# Patient Record
Sex: Female | Born: 1980 | Race: White | Hispanic: No | Marital: Married | Smoking: Never smoker
Health system: Southern US, Community
[De-identification: ages and names within clinical notes are randomized; demographics above are authoritative.]

## PROBLEM LIST (undated history)

## (undated) DIAGNOSIS — N912 Amenorrhea, unspecified: Secondary | ICD-10-CM

## (undated) HISTORY — DX: Amenorrhea, unspecified: N91.2

## (undated) HISTORY — PX: NO PAST SURGERIES: SHX2092

---

## 2005-12-09 ENCOUNTER — Ambulatory Visit (HOSPITAL_COMMUNITY): Admission: RE | Admit: 2005-12-09 | Discharge: 2005-12-09 | Payer: Self-pay | Admitting: Gynecology

## 2006-02-11 ENCOUNTER — Ambulatory Visit: Payer: Self-pay | Admitting: Obstetrics & Gynecology

## 2006-02-19 ENCOUNTER — Ambulatory Visit (HOSPITAL_COMMUNITY): Admission: RE | Admit: 2006-02-19 | Discharge: 2006-02-19 | Payer: Self-pay | Admitting: Obstetrics & Gynecology

## 2006-02-25 ENCOUNTER — Ambulatory Visit: Payer: Self-pay | Admitting: Obstetrics & Gynecology

## 2006-03-04 ENCOUNTER — Ambulatory Visit: Payer: Self-pay | Admitting: Obstetrics & Gynecology

## 2006-03-11 ENCOUNTER — Ambulatory Visit: Payer: Self-pay | Admitting: Family Medicine

## 2006-03-18 ENCOUNTER — Ambulatory Visit: Payer: Self-pay | Admitting: Obstetrics & Gynecology

## 2006-03-25 ENCOUNTER — Ambulatory Visit: Payer: Self-pay | Admitting: Obstetrics & Gynecology

## 2006-03-29 ENCOUNTER — Ambulatory Visit (HOSPITAL_COMMUNITY): Admission: RE | Admit: 2006-03-29 | Discharge: 2006-03-29 | Payer: Self-pay | Admitting: Gynecology

## 2006-04-01 ENCOUNTER — Ambulatory Visit: Payer: Self-pay | Admitting: Obstetrics & Gynecology

## 2006-04-02 ENCOUNTER — Observation Stay (HOSPITAL_COMMUNITY): Admission: AD | Admit: 2006-04-02 | Discharge: 2006-04-02 | Payer: Self-pay | Admitting: Family Medicine

## 2006-04-08 ENCOUNTER — Ambulatory Visit: Payer: Self-pay | Admitting: Obstetrics & Gynecology

## 2006-04-15 ENCOUNTER — Ambulatory Visit: Payer: Self-pay | Admitting: Obstetrics & Gynecology

## 2006-04-22 ENCOUNTER — Ambulatory Visit: Payer: Self-pay | Admitting: Obstetrics & Gynecology

## 2006-04-26 ENCOUNTER — Ambulatory Visit: Payer: Self-pay | Admitting: Gynecology

## 2006-04-26 ENCOUNTER — Inpatient Hospital Stay (HOSPITAL_COMMUNITY): Admission: AD | Admit: 2006-04-26 | Discharge: 2006-04-27 | Payer: Self-pay | Admitting: Gynecology

## 2006-05-24 IMAGING — US US OB FOLLOW-UP
1 series · 13 of 28 positions shown · non-contrast
Comparison: none

CLINICAL DATA: Assess growth.  Size greater than dates.

[Series 1: us ob follow-up · 13 of 54 slices shown]
[im 2/54]
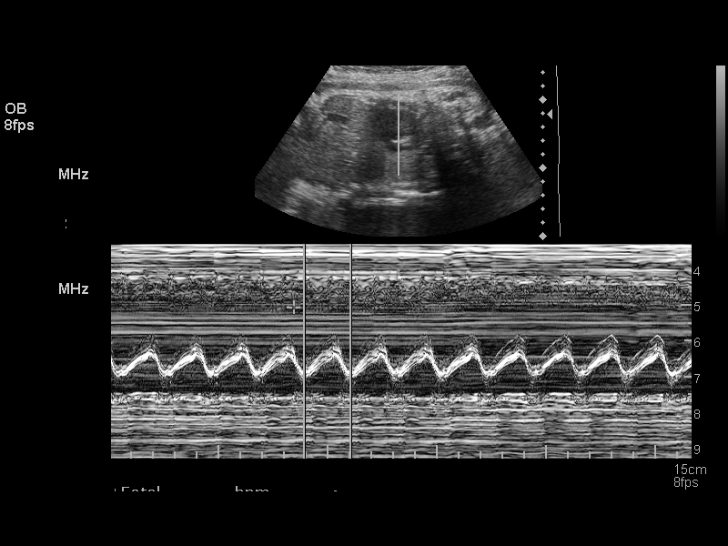
[im 6/54]
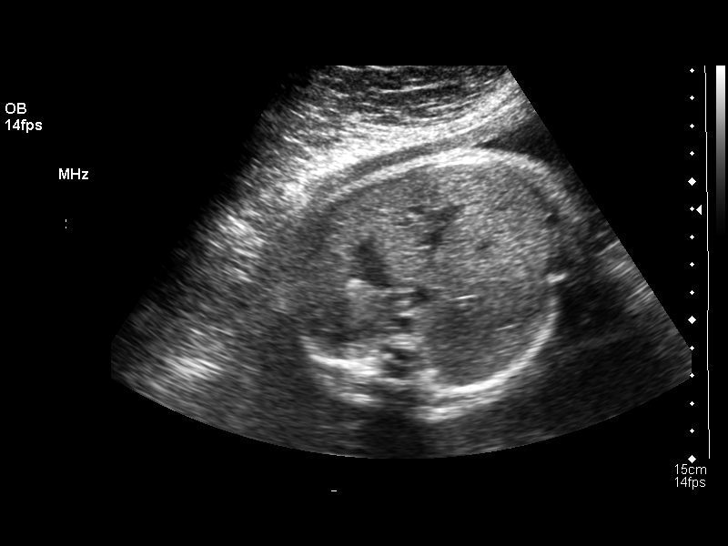
[im 10/54]
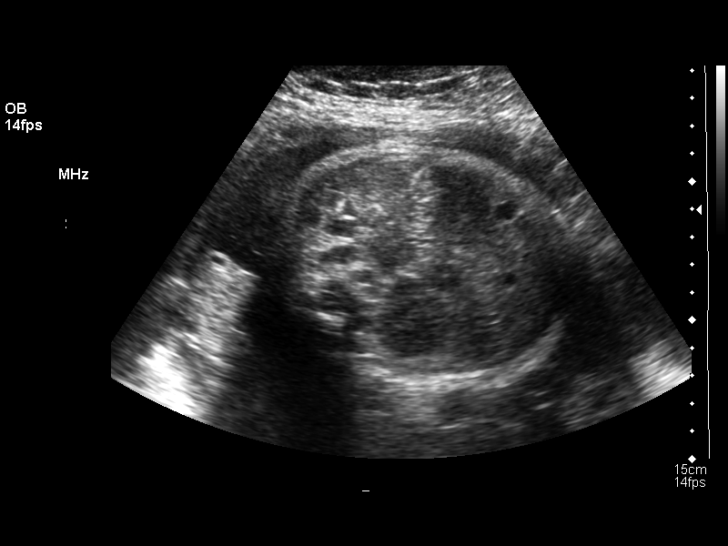
[im 14/54]
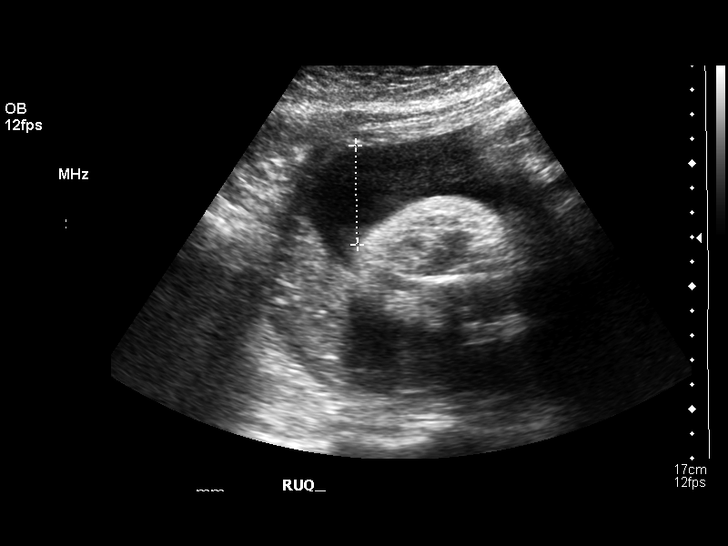
[im 18/54]
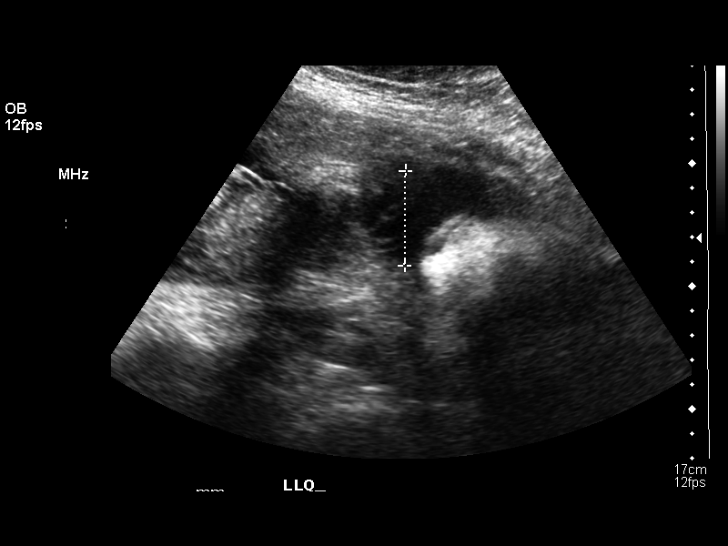
[im 22/54]
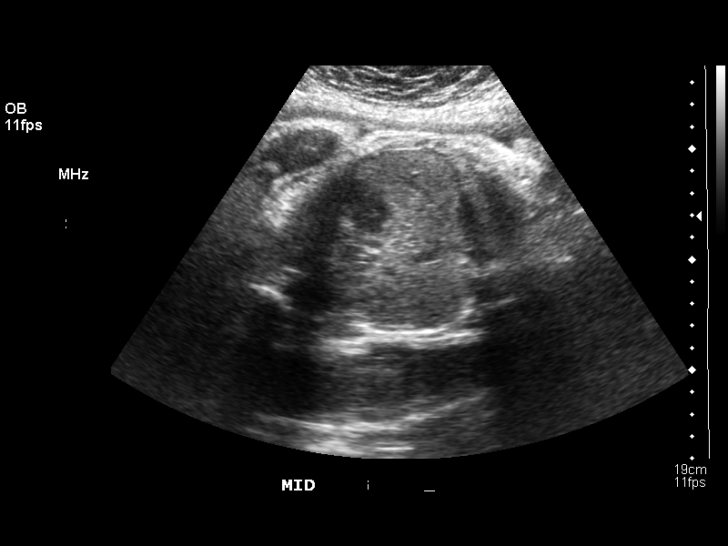
[im 28/54]
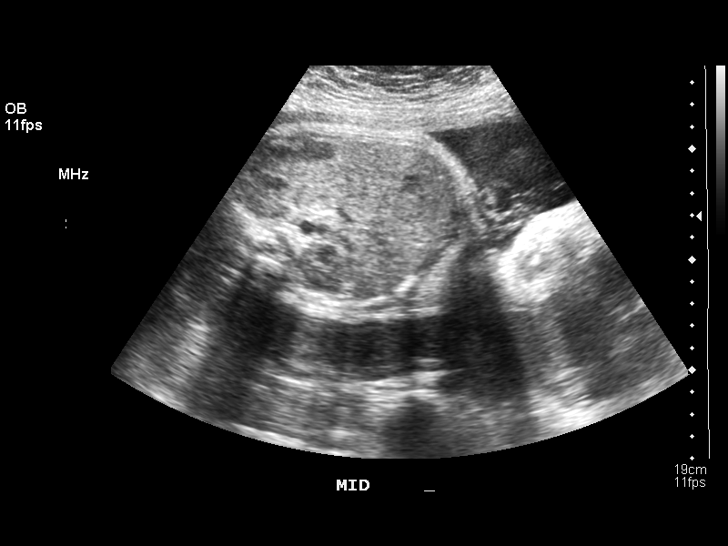
[im 32/54]
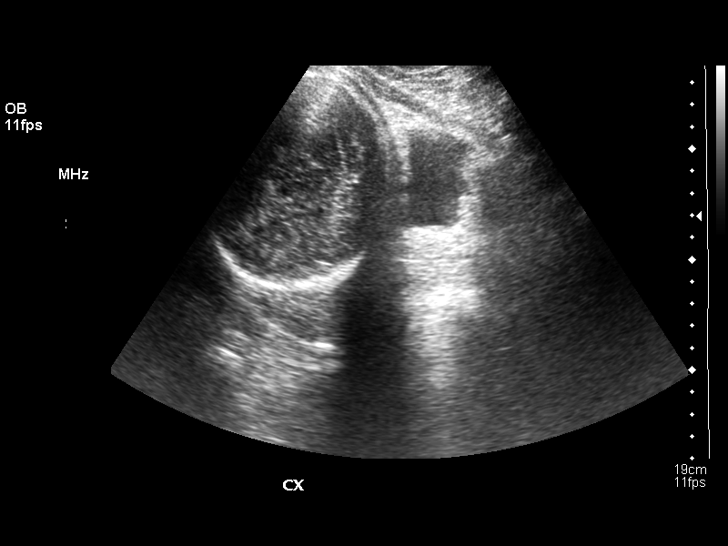
[im 36/54]
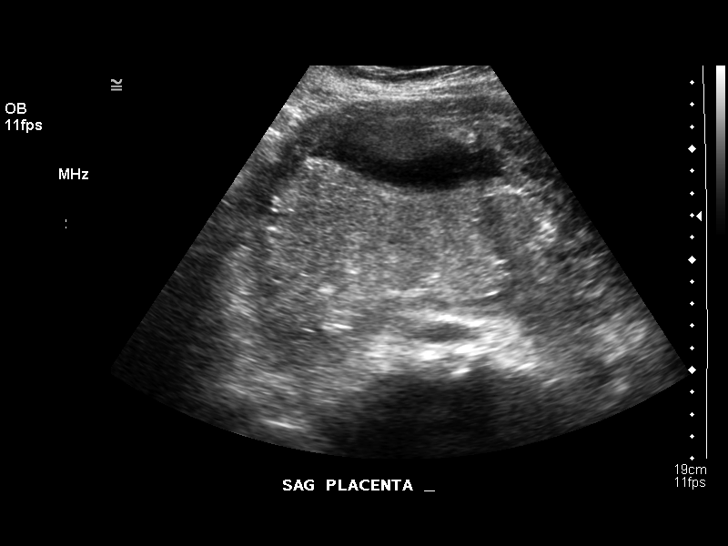
[im 40/54]
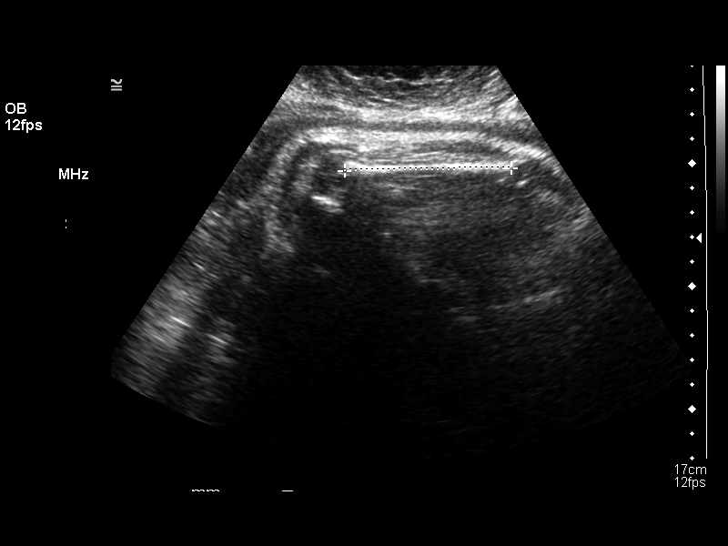
[im 44/54]
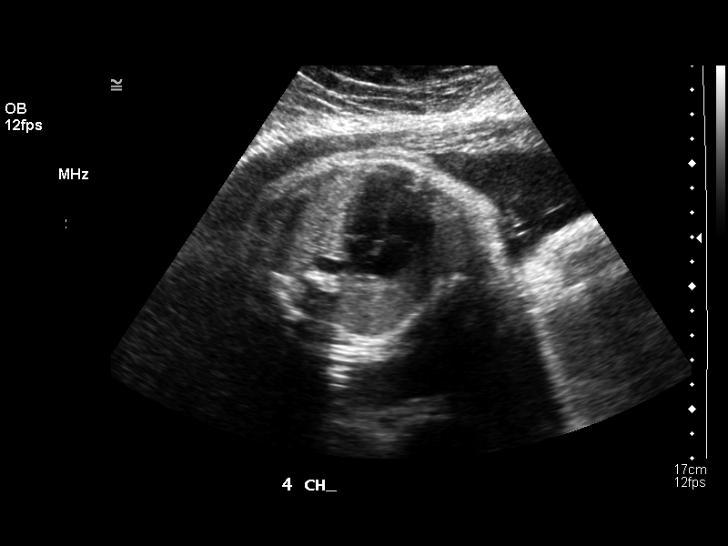
[im 48/54]
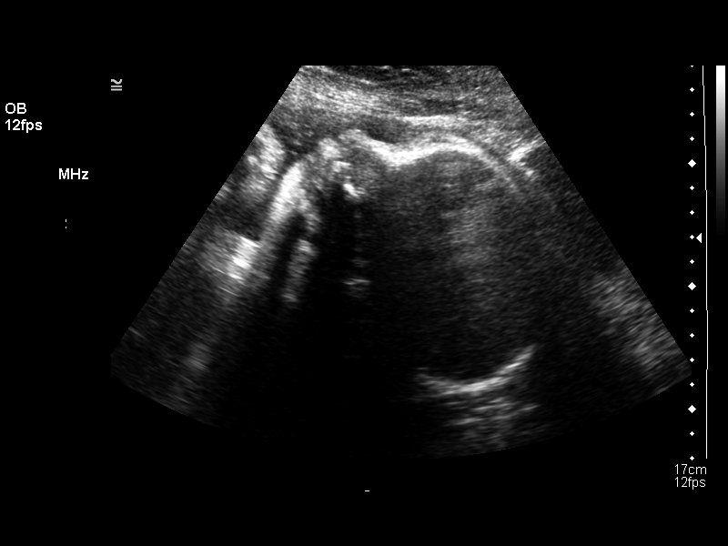
[im 52/54]
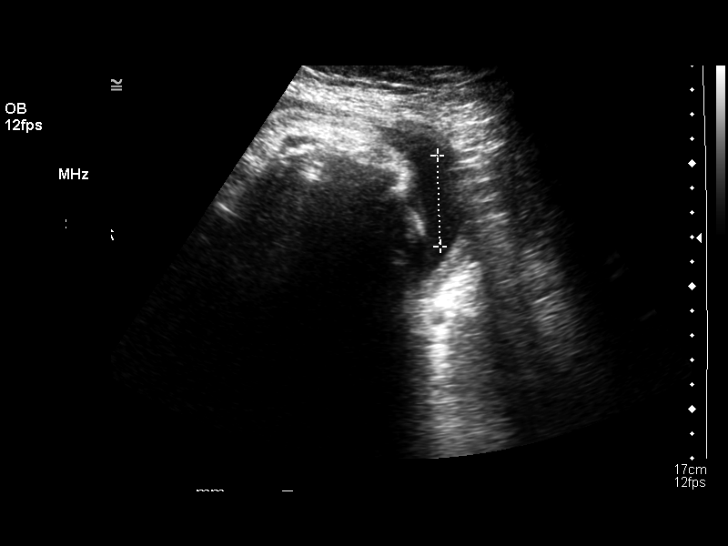

[13 of 28 positions shown; findings below may reference images not displayed]

OBSTETRICAL ULTRASOUND RE-EVALUATION:
Number of Fetuses:  1
Heart Rate: 141 BPM
Movement:  Yes
Breathing:  Yes
Presentation:  Cephalic
Placental Location:  Posterior
Grade:  I
Previa:  No
Amniotic Fluid (subjective):  Normal
Amniotic Fluid (objective):  AFI 13.0 cm (5th-25th %ile = 7.9 to 24.9 cm for 35 weeks) 

FETAL BIOMETRY
BPD:  3.5 cm  34 W 3 D
HC:  32.5 cm  36 W 5 D
AC:  21.7 cm  35 W 4 D
FL:  6.8 cm  34 W 6 D

Mean GA:  35 W 3 D  US EDC:  04/30/06
Assigned GA:  34 W 6 D  Assigned EDC:  05/04/06
Fetal indices are within normal limits. 
EFW:  2770   grams - 35th-13th %ile (3767 ? 0816 g) for 35 weeks 

FETAL ANATOMY
Lateral Ventricles:    Visualized 
Thalami/CSP:    Previously seen 
Posterior Fossa:    Previously seen 
Nuchal Region:    Previously seen 
Spine:    Previously seen 
4 Chamber Heart on Left:  Visualized 
Stomach on Left:  Visualized 
3 Vessel Cord:  Visualized 
Cord Insertion Site:  Previously seen 
Kidneys:  Previously seen 
Bladder:  Visualized 
Extremities:  Previously seen 

ADDITIONAL ANATOMY VISUALIZED:  Profile and diaphragm.

MATERNAL UTERINE AND ADNEXAL FINDINGS
Cervix:  Not evaluated.
IMPRESSION: 1.  Single intrauterine pregnancy demonstrating an estimated gestational age by ultrasound of 35 weeks and 3 days.  Correlation with assigned gestational age of 34 weeks and 6 days correlates with appropriate growth.  Currently, the estimated fetal weight is just below the 75th percentile for a 35 week gestation and the fetal weight was just above the 75th percentile on study performed at 29 weeks which suggests appropriate linear growth.
2.  Subjectively and quantitatively normal amniotic fluid volume.
3.  No late-developing fetal anatomic abnormalities are seen with associated lateral ventricles, four-chamber heart, stomach, kidneys or bladder.

## 2006-07-01 ENCOUNTER — Ambulatory Visit: Payer: Self-pay | Admitting: Obstetrics & Gynecology

## 2006-07-01 ENCOUNTER — Encounter: Payer: Self-pay | Admitting: Obstetrics & Gynecology

## 2007-05-05 ENCOUNTER — Encounter: Payer: Self-pay | Admitting: Obstetrics & Gynecology

## 2007-05-05 ENCOUNTER — Ambulatory Visit: Payer: Self-pay | Admitting: Obstetrics & Gynecology

## 2008-05-03 ENCOUNTER — Ambulatory Visit: Payer: Self-pay | Admitting: Obstetrics & Gynecology

## 2008-05-03 ENCOUNTER — Encounter: Payer: Self-pay | Admitting: Obstetrics & Gynecology

## 2009-05-27 ENCOUNTER — Ambulatory Visit: Payer: Self-pay | Admitting: Obstetrics & Gynecology

## 2009-05-27 ENCOUNTER — Encounter: Payer: Self-pay | Admitting: Obstetrics & Gynecology

## 2010-07-24 ENCOUNTER — Ambulatory Visit: Payer: Self-pay | Admitting: Obstetrics & Gynecology

## 2011-04-21 NOTE — Assessment & Plan Note (Signed)
[name], [name] NO.:  [id number]   MEDICAL RECORD NO.:  [id number]          PATIENT TYPE:  POB   LOCATION:  [facility] at [facility]         FACILITY:  [facility]   PHYSICIAN:  Elsie Lincoln, MD      DATE OF BIRTH:  03-04-1981   DATE OF SERVICE:  05/27/2009                                  CLINIC NOTE   The patient is a 30 year old para 3 female who presents for yearly exam.  The patient uses the [place] for birth control.  She has normal periods  usually.  However, 3 months ago, she had a heavy period and in the past  2 months, she has been amenorrheic.  She did have a negative EPT.  The  patient is on the Yaz, which is a 24-day pill and amenorrhea can be  common.  The patient was reassured.  The patient talked about going on  generic pills, but I explained that there is only 80% of product that  needs to be on generic pill and I would not suggest this given that it  is such a low-dose anyways.  Dianah Field has also been shown not to work with a  lower progesterone in clinical trials.  The patient has no other  complaints.  She is done with childbearing.   PAST MEDICAL HISTORY:  Denies all problems.   PAST SURGICAL HISTORY:  Denies all surgeries.   GYNECOLOGIC HISTORY:  Denies all problems.  She has never had an  abnormal Pap smear, ovarian cysts and fibroid tumors, or sexually-  transmitted diseases.   FAMILY HISTORY:  No breasts, colon or endometrial cancer.  Her sister  did get diagnosed with ovarian cancer.  The patient does not know if she  was ever tested for BRCA 1 and 2.  I encouraged the patient to talk to  her sister and if she has not been tested or refuses to be tested, then  we can test [facility].   MEDICATIONS:  Yaz.   ALLERGIES:  None to medications or latex.   REVIEW OF SYSTEMS:  Negative for all problems.   SOCIAL HISTORY:  The patient lives with her husband, 3 daughters, and  her mother.  She does drink 3 caffeinated beverages a day.  She does not  smoke, drink alcohol, or do addictive drugs.  She has never been a  victim of sexual abuse.   PHYSICAL EXAMINATION:  VITAL SIGNS:  Pulse 80, blood pressure 125/85,  weight 183, height 5 feet 6 inches.  GENERAL:  A well nourished, well developed in no apparent distress.  HEENT:  Normocephalic, atraumatic.  Good dentition.  Thyroid, no masses.  LUNGS:  Clear to auscultation bilaterally.  HEART:  Regular rate and rhythm.  BREASTS:  No masses, no lymphadenopathy.  ABDOMEN:  Soft, nontender.  No organomegaly.  No hernia.  PELVIC:  Genitalia, Tanner V.  Vagina pink, normal rugae.  Urethra and  bladder nontender.  Cervix closed.  Uterus nontender.  Adnexa, no  masses, nontender.  EXTREMITIES:  Nontender.   ASSESSMENT/PLAN:  A 30 year old well-woman female.  1. Pap smear.  2. Yaz prescription given.  3. We will test for breast cancer antigen 1 and  2 if the patient      desires.  4. Offered patient baseline cholesterol labs and a basic metabolic      panel given that she had gestational diabetes mellitus in the past      and never got her followup 2-hour GTT.  The patient will schedule      if she so desires.  Otherwise, return in a year.           ______________________________  Elsie Lincoln, MD     KL/MEDQ  D:  05/27/2009  T:  05/28/2009  Job:  [id number]

## 2011-04-21 NOTE — Assessment & Plan Note (Signed)
NAMEGRACEANNA, [name] NO.:  [id number]   MEDICAL RECORD NO.:  [id number]          PATIENT TYPE:  POB   LOCATION:  [facility] at [place]         FACILITY:  [facility]   PHYSICIAN:  Elsie Lincoln, MD      DATE OF BIRTH:  23-Dec-1980   DATE OF SERVICE:  07/24/2010                                  CLINIC NOTE   The patient is a 30 year old para 3 female who presents for her yearly  exam.  The patient has had one episode of long period with Yasmin.  Later this could be normal with the 20 mcg pill and she is relieved,  otherwise she has been very happy with Yasmin.  The patient's sister has  not inquire for the patient never been tested for BRCA gene given that  she had ovarian cancer.  The patient said that she will check.  The  patient needs a fasting glucose and cholesterol now that she is 28.   PAST MEDICAL HISTORY:  Denies all problems.   PAST SURGICAL HISTORY:  Denies all surgeries.   GYNECOLOGIC HISTORY:  The patient has never had abnormal Pap smear,  ovarian cyst, fibroid tumors, endometriosis, or sexually transmitted  disease.  We discussed that the patient can now get at least every 2  years for Pap smears and the patient feels good about this.   FAMILY HISTORY:  Ovarian cancer in her sister in her 54s.  The patient  will inquire if she has ever been tested for BRCA of the breast, colon,  or endometrial cancer.   MEDICATIONS:  Yasmin.   REVIEW OF SYSTEM:  Negative except for the bleeding.   SOCIAL HISTORY:  No smoking, drinking, or alcohol, never been a victim  of sexual, physical abuse.   PHYSICAL EXAMINATION:  VITAL SIGNS:  Pulse 56, blood pressure 135/77,  weight 156, height 56 inches.  GENERAL:  Well-nourished, well-developed, in no apparent distress.  HEENT:  Normocephalic, atraumatic.  Thyroid, no masses.  LUNGS:  Clear to auscultation bilaterally.  HEART:  Regular rate and rhythm.  BREASTS:  No masses and nontender.  No nipple discharge.  No  lymphadenopathy.  ABDOMEN:  Soft, nontender, no organomegaly, and no hernia.  The patient  has a bruise on the left side of her abdomen that she said she obtained  moving furniture.  GENITALIA:  Tanner V.  Vagina pink, normal rugae.  Cervix closed,  nontender.  Uterus anteverted, nontender.  Adnexa; no masses, nontender.  Rectum, no hemorrhoids.  Bladder nontender.  Urethra nontender.  EXTREMITIES:  No edema.   ASSESSMENT AND PLAN:  A 30 year old well-woman female:  1. Pap smear.  2. Fasting basic metabolic panel and CBC and cholesterol panel.  3. Return to clinic in a year, but no Pap smear next time.  4. Refill on Yasmin.           ______________________________  Elsie Lincoln, MD     KL/MEDQ  D:  07/24/2010  T:  07/25/2010  Job:  [id number]

## 2011-04-21 NOTE — Assessment & Plan Note (Signed)
NAMETROYCE, FEBO NO.:  [id number]   MEDICAL RECORD NO.:  [id number]          PATIENT TYPE:  POB   LOCATION:  [facility] at [facility]         FACILITY:  [facility]   PHYSICIAN:  Elsie Lincoln, MD      DATE OF BIRTH:  November 01, 1981   DATE OF SERVICE:  05/05/2007                                  CLINIC NOTE   The patient is a 31 year old female para 3 who presents for a yearly  exam.  The patient delivered almost a year ago her third baby.  She uses  progesterone only pill for contraception and she is weaning the baby as  we speak and she would like to switch to a combination oral  contraceptive until her husband gets a vasectomy.  She was on Ortho-Tri-  Cyclen in the past but felt that she was moody.  We talked about a 24/4  pill and we agreed on Yasmin.  She also could be a candidate for [facility]  or Depo-Provera __________  if these do not work her for her and she  still finds she is moody.  She is also a candidate for continuous oral  contraceptives or [place]ique.  The patient is not having depression.  No  change in her past medical history.  She did have gestational diabetes  and has not had a 2 hour GTT which she needs to schedule at some point.   PHYSICAL EXAMINATION:  HEENT:  Normocephalic, atraumatic.  Lungs clear  to auscultation __________ .  Heart:  Regular rate and rhythm.  Neck:  No masses.  Breasts:  Nontender, no masses, no lymphadenopathy or nipple  discharge.  Self breast exams encouraged.  Abdomen:  Soft, nontender, no  organomegaly, no hernia.  Genitalia:  __________  normal rugae.  Cervix  closed, nontender.  Uterus mobile, nontender.  Adnexae no masses,  nontender.  Rectum:  No hemorrhoids.  Extremities:  Nontender, no edema.   ASSESSMENT AND PLAN:  A 30 year old female doing well and needs a change  in her contraceptive.   1. Pap smear.  2. Two samples of Yasmin given.  If she likes this then we will call      this in to her pharmacy.  If she is  not doing well then we will      consider other methods and she needs to come back in and talk to [place]      at that time.  3. Needs 2 hour GTT at some point.  4. Physical in a year.           ______________________________  Elsie Lincoln, MD     KL/MEDQ  D:  05/05/2007  T:  05/05/2007  Job:  [id number]

## 2011-04-21 NOTE — Assessment & Plan Note (Signed)
NAMEALTA, [name] NO.:  [id number]   MEDICAL RECORD NO.:  [id number]          PATIENT TYPE:  POB   LOCATION:  [facility] at [facility]         [facility]ACILITY:  [facility]   PHYSICIAN:  Elsie Lincoln, MD      DATE OF BIRTH:  07/10/1981   DATE OF SERVICE:  05/03/2008                                  CLINIC NOTE    The patient is a 30 year old para 3 female who presents for her yearly  exam.  Her last menstrual period was Apr 09, 2008.  She is on Yaz birth  control and is happy with it, other than bleeding right before the  Placebo pills, this is most likely due to the Yaz's low estrogen dose,  but she is not skipping any pills and is protected.  I offered a change  to her and she is fine where she is at.  She has absolutely no  complaints.   PAST MEDICAL HISTORY:  No changes in the past year.  She never did get  her 2-hour GTT from being positive for GDM, so she said she would do so.  No surgeries in the past year.  GYN complaints, none.   ALLERGIES:  None.   MEDICATIONS:  Yaz.   FAMILY HISTORY:  No breast, ovarian, colon, or endometrial cancer.  She  said there was someone who did need a hysterectomy in her family.  She  was not sure if it was for cancer or not, but they did not receive  chemotherapy or radiation.   PHYSICAL EXAMINATION:  VITAL SIGNS:  Blood pressure 136/90, weight 173,  height 5 feet 5 inches, and pulse 71.  GENERAL:  Well-nourished and well-developed in no apparent distress.  HEENT:  Normocephalic and atraumatic.  Good dentition.  The patient has  a fair amount of froth.  LUNGS:  Clear to auscultation bilaterally.  HEART:  Regular rate and rhythm.  BREASTS:  No masses.  Nontender.  No lymphadenopathy.  ABDOMEN:  No hepatosplenomegaly.  No hernia.  Soft and nontender.  GENITALIA:  Tanner V.  Urethra, nontender.  No cystocele or rectocele.  Vagina, pink.  Normal rugae.  Cervix, closed and nontender.  Uterus,  anteverted and nontender.  Adnexa, no  masses and nontender.  No  hemorrhoids.  EXTREMITIES:  No edema.   ASSESSMENT/PLAN:  A 30 year old female with well-woman exam.  1. Pap smear.  2. Needs 2-hour glucose tolerance test.  3. Borderline hypertension.  We will recheck when she comes in for her      2-hour glucose tolerance test.  We will also do a fasting      cholesterol at that time.           ______________________________  Elsie Lincoln, MD     KL/MEDQ  D:  05/03/2008  T:  05/03/2008  Job:  [id number]

## 2011-04-24 NOTE — Group Therapy Note (Signed)
[name], [name] NO.:  [id number]   MEDICAL RECORD NO.:  [id number]          PATIENT TYPE:  POB   LOCATION[facility]  [facility]                   FACILITY:  [facility]   PHYSICIAN:  Nancy Lincoln, MD      DATE OF BIRTH:  Nov 07, 1981   DATE OF SERVICE:  07/01/2006                                    CLINIC NOTE   The patient is a 30 year old female who presents for a postpartum exam. The  patient has delivered NSVD approximately 8 weeks ago without complications,  this was her third delivery. She had no lacerations. She does have mild GDM  that was controlled by diet. She is breast feeding. She resumed sexual  intercourse several weeks ago without complications. She is on Micronor. She  had a little spotting around July 4 but is otherwise doing fine. She plans  on breast feeding until the baby teeth's and then she will switch to  combined oral contraceptive. She wishes her partner to get a vasectomy, not  planning anymore children.   PHYSICAL EXAMINATION:  BREASTS:  No evidence of mastitis.  ABDOMEN:  Soft, tender, nondistended.  GENITALIA:  __________, perineum intact. Cervix is closed, nontender. Uterus  firm, anteverted, nontender.   ASSESSMENT/PLAN:  A 30 year old female well postpartum check, no signs of  depression. Continue progesterone only pill. Return to clinic in a year or  p.r.n.           ______________________________  Nancy Lincoln, MD     KL/MEDQ  D:  07/01/2006  T:  07/01/2006  Job:  [id number]

## 2011-04-24 NOTE — Discharge Summary (Signed)
[name], [name]              ACCOUNT NO.:  [id number]   MEDICAL RECORD NO.:  [id number]          PATIENT TYPE:  WOC   LOCATION:  [facility]                          FACILITY:  [facility]   PHYSICIAN:  Angie B. Merlene Morse, MD  DATE OF BIRTH:  1981-01-03   DATE OF ADMISSION:  04/01/2006  DATE OF DISCHARGE:  04/02/2006                                 DISCHARGE SUMMARY   ADMISSION DIAGNOSES:  1.  Intrauterine pregnancy at 35-3/7 weeks.  2.  Gestational diabetes.  3.  Latent labor.   HISTORY OF PRESENT ILLNESS:  The patient is a 30 year old, G3, P2-0-0-2 who  presented complaining of contractions.  Her exam on initial presentation was  3 cm, 50% and -2.   HOSPITAL COURSE:  The patient was monitored in the MAU.  She was found to  have cervical change.  In 1 hour and 10 minutes, she changed to 3-4 cm, 50%  and -2.  She was admitted and started on Unasyn as she had unknown GBS and  was given a dose of Procardia.  She remained stable and her contractions  resolved.  Her vaginal exam at the time of discharge was 4 cm, 50% and -2.  The patient was discharged to home.   FOLLOW UP:  The patient was to follow up as scheduled.   SPECIAL INSTRUCTIONS:  She was to return to the hospital for any signs of  labor.           ______________________________  [name] Merlene Morse, MD     ABC/MEDQ  D:  04/02/2006  T:  04/03/2006  Job:  [id number]

## 2011-07-30 ENCOUNTER — Telehealth: Payer: Self-pay | Admitting: *Deleted

## 2011-07-30 ENCOUNTER — Other Ambulatory Visit: Payer: Self-pay | Admitting: Obstetrics & Gynecology

## 2011-07-30 DIAGNOSIS — IMO0001 Reserved for inherently not codable concepts without codable children: Secondary | ICD-10-CM

## 2011-07-30 MED ORDER — DROSPIRENONE-ETHINYL ESTRADIOL 3-0.02 MG PO TABS: 1.0000 | ORAL_TABLET | Freq: Every day | ORAL | Status: DC

## 2011-07-30 NOTE — Telephone Encounter (Signed)
Patient needs refill of Yaz.  Appointment made for September and refills were called into pharmacy.

## 2011-08-27 ENCOUNTER — Encounter: Payer: Self-pay | Admitting: Obstetrics & Gynecology

## 2011-08-27 ENCOUNTER — Ambulatory Visit (INDEPENDENT_AMBULATORY_CARE_PROVIDER_SITE_OTHER): Payer: No Typology Code available for payment source | Admitting: Obstetrics & Gynecology

## 2011-08-27 DIAGNOSIS — Z01419 Encounter for gynecological examination (general) (routine) without abnormal findings: Secondary | ICD-10-CM

## 2011-08-27 DIAGNOSIS — Z1272 Encounter for screening for malignant neoplasm of vagina: Secondary | ICD-10-CM

## 2011-08-27 DIAGNOSIS — Z0000 Encounter for general adult medical examination without abnormal findings: Secondary | ICD-10-CM

## 2011-08-27 NOTE — Progress Notes (Signed)
  Subjective:    Patient ID: [name], female    DOB: Feb 12, 1981, 30 y.o.   MRN: [id number]  HPI    Review of Systems     Objective:   Physical Exam        Assessment & Plan:   Subjective:    [name] is a 30 y.o. female who presents for an annual exam. The patient has no complaints today. The patient is sexually active. GYN screening history: last pap: was normal. The patient wears seatbelts: yes. The patient participates in regular exercise: yes. Has the patient ever been transfused or tattooed?: no. The patient reports that there is not domestic violence in her life.   Menstrual History: OB History    Grav Para Term Preterm Abortions TAB SAB Ect Mult Living   3 3        3       Menarche age: 62 No LMP recorded.    The following portions of the patient's history were reviewed and updated as appropriate: allergies, current medications, past family history, past medical history, past social history, past surgical history and problem list.  Review of Systems A comprehensive review of systems was negative.  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  General Appearance:    Alert, cooperative, no distress, appears stated age  Head:    Normocephalic, without obvious abnormality, atraumatic  Eyes:    PERRL, conjunctiva/corneas clear, EOM's intact, fundi    benign, both eyes  Ears:    Normal TM's and external ear canals, both ears  Nose:   Nares normal, septum midline, mucosa normal, no drainage    or sinus tenderness  Throat:   Lips, mucosa, and tongue normal; teeth and gums normal  Neck:   Supple, symmetrical, trachea midline, no adenopathy;    thyroid:  no enlargement/tenderness/nodules; no carotid   bruit or JVD  Back:     Symmetric, no curvature, ROM normal, no CVA tenderness  Lungs:     Clear to auscultation bilaterally, respirations unlabored    Chest Wall:    No tenderness or deformity   Heart:    Regular rate and rhythm, S1 and S2 normal, no murmur, rub   or gallop  Breast Exam:    No tenderness, masses, or nipple abnormality  Abdomen:     Soft, non-tender, bowel sounds active all four quadrants,    no masses, no organomegaly  Genitalia:    Normal female without lesion, discharge or tenderness,NSSA, NT, no adnexal masses  Rectal:      Extremities:   Extremities normal, atraumatic, no cyanosis or edema  Pulses:   2+ and symmetric all extremities  Skin:   Skin color, texture, turgor normal, no rashes or lesions  Lymph nodes:   Cervical, supraclavicular, and axillary nodes normal  Neurologic:   CNII-XII intact, normal strength, sensation and reflexes    throughout  .    Assessment:    Healthy female exam.    Plan:     Await pap smear results.  Rec SBE/SVE monthly

## 2012-05-11 ENCOUNTER — Encounter: Payer: Self-pay | Admitting: Obstetrics & Gynecology

## 2012-05-11 ENCOUNTER — Ambulatory Visit (INDEPENDENT_AMBULATORY_CARE_PROVIDER_SITE_OTHER): Payer: BC Managed Care – PPO | Admitting: Obstetrics & Gynecology

## 2012-05-11 VITALS — BP 136/79 | HR 66 | Ht 66.0 in | Wt 163.0 lb

## 2012-05-11 DIAGNOSIS — Z309 Encounter for contraceptive management, unspecified: Secondary | ICD-10-CM

## 2012-05-11 DIAGNOSIS — IMO0001 Reserved for inherently not codable concepts without codable children: Secondary | ICD-10-CM

## 2012-05-11 MED ORDER — DROSPIRENONE-ETHINYL ESTRADIOL 3-0.02 MG PO TABS: 1.0000 | ORAL_TABLET | Freq: Every day | ORAL | Status: DC

## 2012-05-11 NOTE — Patient Instructions (Signed)
Return to clinic for any scheduled appointments or for any gynecologic concerns as needed.   

## 2012-05-11 NOTE — Progress Notes (Signed)
Patient came in today for refill of birth control.  No other concerns.  Normal pap smear and exam in 08/2011. OCPs refilled; she will return for annual exam after 08/2012. Return to clinic for any GYN concerns.

## 2013-03-30 ENCOUNTER — Ambulatory Visit: Payer: BC Managed Care – PPO | Admitting: Obstetrics & Gynecology

## 2013-04-14 ENCOUNTER — Encounter: Payer: Self-pay | Admitting: Obstetrics & Gynecology

## 2013-04-14 ENCOUNTER — Ambulatory Visit (INDEPENDENT_AMBULATORY_CARE_PROVIDER_SITE_OTHER): Payer: BC Managed Care – PPO | Admitting: Obstetrics & Gynecology

## 2013-04-14 VITALS — BP 130/85 | HR 66 | Resp 16 | Ht 66.0 in | Wt 174.0 lb

## 2013-04-14 DIAGNOSIS — Z1151 Encounter for screening for human papillomavirus (HPV): Secondary | ICD-10-CM

## 2013-04-14 DIAGNOSIS — Z01419 Encounter for gynecological examination (general) (routine) without abnormal findings: Secondary | ICD-10-CM

## 2013-04-14 DIAGNOSIS — Z0000 Encounter for general adult medical examination without abnormal findings: Secondary | ICD-10-CM

## 2013-04-14 DIAGNOSIS — Z124 Encounter for screening for malignant neoplasm of cervix: Secondary | ICD-10-CM

## 2013-04-14 MED ORDER — GIANVI 3-0.02 MG PO TABS: 1.0000 | ORAL_TABLET | Freq: Every day | ORAL | Status: DC

## 2013-04-14 NOTE — Progress Notes (Signed)
Subjective:    [name] is a 32 y.o. female who presents for an annual exam. The patient has no complaints today. She would like a refill of her OCPs (takes them continuously). The patient is sexually active. GYN screening history: last pap: was normal. The patient wears seatbelts: yes. The patient participates in regular exercise: yes. Has the patient ever been transfused or tattooed?: no. The patient reports that there is not domestic violence in her life.   Menstrual History: OB History   Grav Para Term Preterm Abortions TAB SAB Ect Mult Living   3 3        3       Menarche age: 65  Patient's last menstrual period was 03/31/2013.    The following portions of the patient's history were reviewed and updated as appropriate: allergies, current medications, past family history, past medical history, past social history, past surgical history and problem list.  Review of Systems A comprehensive review of systems was negative. 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 She denies dyspareunia.    Objective:    BP 130/85  Pulse 66  Resp 16  Ht 5\' 6"  (1.676 m)  Wt 174 lb (78.926 kg)  BMI 28.1 kg/m2  LMP 03/31/2013  General Appearance:    Alert, cooperative, no distress, appears stated age  Head:    Normocephalic, without obvious abnormality, atraumatic  Eyes:    PERRL, conjunctiva/corneas clear, EOM's intact, fundi    benign, both eyes  Ears:    Normal TM's and external ear canals, both ears  Nose:   Nares normal, septum midline, mucosa normal, no drainage    or sinus tenderness  Throat:   Lips, mucosa, and tongue normal; teeth and gums normal  Neck:   Supple, symmetrical, trachea midline, no adenopathy;    thyroid:  no enlargement/tenderness/nodules; no carotid   bruit or JVD  Back:     Symmetric, no curvature, ROM normal, no CVA tenderness  Lungs:     Clear to auscultation bilaterally, respirations unlabored  Chest Wall:    No tenderness or deformity   Heart:    Regular rate  and rhythm, S1 and S2 normal, no murmur, rub   or gallop  Breast Exam:    No tenderness, masses, or nipple abnormality  Abdomen:     Soft, non-tender, bowel sounds active all four quadrants,    no masses, no organomegaly  Genitalia:    Normal female without lesion, discharge or tenderness, NSSA, NT, mobile, normal adnexal exam     Extremities:   Extremities normal, atraumatic, no cyanosis or edema  Pulses:   2+ and symmetric all extremities  Skin:   Skin color, texture, turgor normal, no rashes or lesions  Lymph nodes:   Cervical, supraclavicular, and axillary nodes normal  Neurologic:   CNII-XII intact, normal strength, sensation and reflexes    throughout  .    Assessment:    Healthy female exam.    Plan:     Thin prep Pap smear. fasting screening labs in the future

## 2014-06-25 ENCOUNTER — Telehealth: Payer: Self-pay | Admitting: *Deleted

## 2014-06-25 MED ORDER — GIANVI 3-0.02 MG PO TABS: 1.0000 | ORAL_TABLET | Freq: Every day | ORAL | Status: DC

## 2014-06-25 NOTE — Telephone Encounter (Signed)
Patient needs a refill of her ocp.

## 2014-07-31 ENCOUNTER — Ambulatory Visit (INDEPENDENT_AMBULATORY_CARE_PROVIDER_SITE_OTHER): Payer: BC Managed Care – PPO | Admitting: Obstetrics & Gynecology

## 2014-07-31 ENCOUNTER — Encounter: Payer: Self-pay | Admitting: Obstetrics & Gynecology

## 2014-07-31 VITALS — BP 123/93 | HR 70 | Ht 66.0 in | Wt 181.8 lb

## 2014-07-31 DIAGNOSIS — Z1151 Encounter for screening for human papillomavirus (HPV): Secondary | ICD-10-CM | POA: Diagnosis not present

## 2014-07-31 DIAGNOSIS — Z124 Encounter for screening for malignant neoplasm of cervix: Secondary | ICD-10-CM | POA: Diagnosis not present

## 2014-07-31 DIAGNOSIS — Z0000 Encounter for general adult medical examination without abnormal findings: Secondary | ICD-10-CM

## 2014-07-31 DIAGNOSIS — Z3041 Encounter for surveillance of contraceptive pills: Secondary | ICD-10-CM

## 2014-07-31 DIAGNOSIS — Z01419 Encounter for gynecological examination (general) (routine) without abnormal findings: Secondary | ICD-10-CM | POA: Diagnosis not present

## 2014-07-31 NOTE — Progress Notes (Signed)
Subjective:    [name] is a 33 y.o. female who presents for an annual exam. The patient has no complaints today. She is happy with using her OCPs in a continuous fashion as she likes amenorrhea. The patient is sexually active. GYN screening history: last pap: was normal. The patient wears seatbelts: yes. The patient participates in regular exercise: yes. Has the patient ever been transfused or tattooed?: no. The patient reports that there is not domestic violence in her life.   Menstrual History: OB History   Grav Para Term Preterm Abortions TAB SAB Ect Mult Living   Menarche age: 64  No LMP recorded. Patient is not currently having periods (Reason: Oral contraceptives).    The following portions of the patient's history were reviewed and updated as appropriate: allergies, current medications, past family history, past medical history, past social history, past surgical history and problem list.  Review of Systems A comprehensive review of systems was negative. 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 I explained ACOG rec regarding pap smears and she would still like a pap today.   Objective:    BP 123/93  Pulse 70  Ht  (1.676 m)  Wt 181 lb 12.8 oz (82.464 kg)  BMI 29.36 kg/m2  General Appearance:    Alert, cooperative, no distress, appears stated age  Head:    Normocephalic, without obvious abnormality, atraumatic  Eyes:    PERRL, conjunctiva/corneas clear, EOM's intact, fundi    benign, both eyes  Ears:    Normal TM's and external ear canals, both ears  Nose:   Nares normal, septum midline, mucosa normal, no drainage    or sinus tenderness  Throat:   Lips, mucosa, and tongue normal; teeth and gums normal  Neck:   Supple, symmetrical, trachea midline, no adenopathy;    thyroid:  no enlargement/tenderness/nodules; no carotid   bruit or JVD  Back:     Symmetric, no curvature, ROM normal, no CVA tenderness  Lungs:     Clear to auscultation  bilaterally, respirations unlabored  Chest Wall:    No tenderness or deformity   Heart:    Regular rate and rhythm, S1 and S2 normal, no murmur, rub   or gallop  Breast Exam:    No tenderness, masses, or nipple abnormality  Abdomen:     Soft, non-tender, bowel sounds active all four quadrants,    no masses, no organomegaly  Genitalia:    Normal female without lesion, discharge or tenderness, NSSA, NT, mobile, normal adnexal exam     Extremities:   Extremities normal, atraumatic, no cyanosis or edema  Pulses:   2+ and symmetric all extremities  Skin:   Skin color, texture, turgor normal, no rashes or lesions  Lymph nodes:   Cervical, supraclavicular, and axillary nodes normal  Neurologic:   CNII-XII intact, normal strength, sensation and reflexes    throughout  .    Assessment:    Healthy female exam.    Plan:     Breast self exam technique reviewed and patient encouraged to perform self-exam monthly. Thin prep Pap smear. with cotesting

## 2014-08-01 LAB — CYTOLOGY - PAP

## 2014-10-08 ENCOUNTER — Encounter: Payer: Self-pay | Admitting: Obstetrics & Gynecology

## 2014-10-29 ENCOUNTER — Telehealth: Payer: Self-pay | Admitting: *Deleted

## 2014-10-29 DIAGNOSIS — Z308 Encounter for other contraceptive management: Secondary | ICD-10-CM

## 2014-10-29 MED ORDER — NORGESTREL-ETHINYL ESTRADIOL 0.3-30 MG-MCG PO TABS: 1.0000 | ORAL_TABLET | Freq: Every day | ORAL | Status: DC

## 2014-10-29 NOTE — Telephone Encounter (Signed)
Patients new insurance does not cover her current ocp and she has sent a list of covered medication.  I have called in the change to her pharmacy on file.

## 2015-05-20 ENCOUNTER — Telehealth: Payer: Self-pay | Admitting: *Deleted

## 2015-05-20 DIAGNOSIS — Z308 Encounter for other contraceptive management: Secondary | ICD-10-CM

## 2015-05-20 MED ORDER — NORGESTREL-ETHINYL ESTRADIOL 0.3-30 MG-MCG PO TABS: 1.0000 | ORAL_TABLET | Freq: Every day | ORAL | Status: DC

## 2015-05-20 NOTE — Telephone Encounter (Signed)
Pt walked in the office and needed refills.  I have sent in refills to her pharmacy but made patient aware she is due for her annual end of August.

## 2015-09-06 ENCOUNTER — Encounter: Payer: Self-pay | Admitting: Obstetrics & Gynecology

## 2015-09-06 ENCOUNTER — Ambulatory Visit (INDEPENDENT_AMBULATORY_CARE_PROVIDER_SITE_OTHER): Payer: 59 | Admitting: Obstetrics & Gynecology

## 2015-09-06 VITALS — BP 136/87 | HR 79 | Ht 66.0 in | Wt 187.0 lb

## 2015-09-06 DIAGNOSIS — Z124 Encounter for screening for malignant neoplasm of cervix: Secondary | ICD-10-CM

## 2015-09-06 DIAGNOSIS — Z1151 Encounter for screening for human papillomavirus (HPV): Secondary | ICD-10-CM

## 2015-09-06 DIAGNOSIS — Z3041 Encounter for surveillance of contraceptive pills: Secondary | ICD-10-CM | POA: Diagnosis not present

## 2015-09-06 DIAGNOSIS — Z308 Encounter for other contraceptive management: Secondary | ICD-10-CM

## 2015-09-06 DIAGNOSIS — Z01419 Encounter for gynecological examination (general) (routine) without abnormal findings: Secondary | ICD-10-CM

## 2015-09-06 MED ORDER — NORGESTREL-ETHINYL ESTRADIOL 0.3-30 MG-MCG PO TABS: 1.0000 | ORAL_TABLET | Freq: Every day | ORAL | Status: DC

## 2015-09-06 NOTE — Addendum Note (Signed)
Addended by: Gita Kudo on: 09/06/2015 10:58 AM   Modules accepted: Orders

## 2015-09-06 NOTE — Progress Notes (Signed)
Subjective:    [name] is a 34 y.o. MW P3 (16, 15, 68 daughters) female who presents for an annual exam. The patient has no complaints today. The patient is sexually active. GYN screening history: last pap: was normal. The patient wears seatbelts: yes. The patient participates in regular exercise: yes. Has the patient ever been transfused or tattooed?: no. The patient reports that there is not domestic violence in her life.   Menstrual History: OB History    Gravida Para Term Preterm AB TAB SAB Ectopic Multiple Living   Menarche age: 14 Patient's last menstrual period was 08/25/2015.    The following portions of the patient's history were reviewed and updated as appropriate: allergies, current medications, past family history, past medical history, past social history, past surgical history and problem list.  Review of Systems Pertinent items noted in HPI and remainder of comprehensive ROS otherwise negative.  Homemaker, married for 15 years.   Objective:    BP 136/87 mmHg  Pulse 79  Ht  (1.676 m)  Wt 187 lb (84.823 kg)  BMI 30.20 kg/m2  LMP 08/25/2015  General Appearance:    Alert, cooperative, no distress, appears stated age  Head:    Normocephalic, without obvious abnormality, atraumatic  Eyes:    PERRL, conjunctiva/corneas clear, EOM's intact, fundi    benign, both eyes  Ears:    Normal TM's and external ear canals, both ears  Nose:   Nares normal, septum midline, mucosa normal, no drainage    or sinus tenderness  Throat:   Lips, mucosa, and tongue normal; teeth and gums normal  Neck:   Supple, symmetrical, trachea midline, no adenopathy;    thyroid:  no enlargement/tenderness/nodules; no carotid   bruit or JVD  Back:     Symmetric, no curvature, ROM normal, no CVA tenderness  Lungs:     Clear to auscultation bilaterally, respirations unlabored  Chest Wall:    No tenderness or deformity   Heart:    Regular rate and rhythm, S1 and S2 normal,  no murmur, rub   or gallop  Breast Exam:    No tenderness, masses, or nipple abnormality  Abdomen:     Soft, non-tender, bowel sounds active all four quadrants,    no masses, no organomegaly  Genitalia:    Normal female without lesion, discharge or tenderness, NSSA, NT, mobile, normal adnexal exam     Extremities:   Extremities normal, atraumatic, no cyanosis or edema  Pulses:   2+ and symmetric all extremities  Skin:   Skin color, texture, turgor normal, no rashes or lesions  Lymph nodes:   Cervical, supraclavicular, and axillary nodes normal  Neurologic:   CNII-XII intact, normal strength, sensation and reflexes    throughout  .    Assessment:    Healthy female exam.    Plan:     Breast self exam technique reviewed and patient encouraged to perform self-exam monthly. Thin prep Pap smear. with cotesting Declines flu vaccine Refill OCPs

## 2015-09-10 LAB — CYTOLOGY - PAP

## 2015-10-17 ENCOUNTER — Telehealth: Payer: Self-pay | Admitting: *Deleted

## 2015-10-17 DIAGNOSIS — Z3041 Encounter for surveillance of contraceptive pills: Secondary | ICD-10-CM

## 2015-10-17 MED ORDER — LEVONORGEST-ETH ESTRAD 91-DAY 0.15-0.03 MG PO TABS: 1.0000 | ORAL_TABLET | Freq: Every day | ORAL | Status: DC

## 2015-10-17 NOTE — Telephone Encounter (Signed)
Sent new rx for birth control which was on her list for free birth control pills.

## 2015-10-17 NOTE — Telephone Encounter (Signed)
-----   Message from Olevia BowensJacinda S Battle sent at 10/17/2015 10:16 AM EST ----- Regarding: Rx Request Contact: [phone] Recently switch INS, would like to get birth control switched to one of the free options (I have printed list up here with me), currently taking norgestrel-ethinyl estradiol which is on a different tier and wouldn't be free

## 2016-12-10 ENCOUNTER — Other Ambulatory Visit: Payer: Self-pay | Admitting: Obstetrics & Gynecology

## 2016-12-10 DIAGNOSIS — Z3041 Encounter for surveillance of contraceptive pills: Secondary | ICD-10-CM

## 2018-02-02 ENCOUNTER — Other Ambulatory Visit: Payer: Self-pay | Admitting: *Deleted

## 2018-02-02 DIAGNOSIS — Z3041 Encounter for surveillance of contraceptive pills: Secondary | ICD-10-CM

## 2018-02-02 MED ORDER — LEVONORGEST-ETH ESTRAD 91-DAY 0.15-0.03 MG PO TABS: 1.0000 | ORAL_TABLET | Freq: Every day | ORAL | 4 refills | Status: DC

## 2019-03-13 ENCOUNTER — Other Ambulatory Visit: Payer: Self-pay | Admitting: Obstetrics & Gynecology

## 2019-03-13 DIAGNOSIS — Z3041 Encounter for surveillance of contraceptive pills: Secondary | ICD-10-CM

## 2019-06-21 ENCOUNTER — Other Ambulatory Visit: Payer: Self-pay | Admitting: Obstetrics & Gynecology

## 2019-06-21 DIAGNOSIS — Z3041 Encounter for surveillance of contraceptive pills: Secondary | ICD-10-CM

## 2019-09-12 ENCOUNTER — Other Ambulatory Visit: Payer: Self-pay | Admitting: *Deleted

## 2019-09-12 DIAGNOSIS — Z3041 Encounter for surveillance of contraceptive pills: Secondary | ICD-10-CM

## 2019-09-12 MED ORDER — LEVONORGEST-ETH ESTRAD 91-DAY 0.15-0.03 MG PO TABS: 1.0000 | ORAL_TABLET | Freq: Every day | ORAL | 0 refills | Status: DC

## 2019-09-12 NOTE — Telephone Encounter (Signed)
Okay ti give one more month refill for OCP per Dr Kennon Rounds, pt has appointment in November

## 2019-10-10 ENCOUNTER — Ambulatory Visit (INDEPENDENT_AMBULATORY_CARE_PROVIDER_SITE_OTHER): Payer: BLUE CROSS/BLUE SHIELD | Admitting: Obstetrics & Gynecology

## 2019-10-10 ENCOUNTER — Other Ambulatory Visit: Payer: Self-pay

## 2019-10-10 ENCOUNTER — Encounter: Payer: Self-pay | Admitting: Obstetrics & Gynecology

## 2019-10-10 VITALS — BP 139/82 | HR 76 | Ht 66.0 in | Wt 196.0 lb

## 2019-10-10 DIAGNOSIS — Z01419 Encounter for gynecological examination (general) (routine) without abnormal findings: Secondary | ICD-10-CM | POA: Diagnosis not present

## 2019-10-10 DIAGNOSIS — Z1151 Encounter for screening for human papillomavirus (HPV): Secondary | ICD-10-CM | POA: Diagnosis not present

## 2019-10-10 DIAGNOSIS — N760 Acute vaginitis: Secondary | ICD-10-CM | POA: Diagnosis not present

## 2019-10-10 DIAGNOSIS — N898 Other specified noninflammatory disorders of vagina: Secondary | ICD-10-CM

## 2019-10-10 DIAGNOSIS — B9689 Other specified bacterial agents as the cause of diseases classified elsewhere: Secondary | ICD-10-CM | POA: Diagnosis not present

## 2019-10-10 DIAGNOSIS — Z124 Encounter for screening for malignant neoplasm of cervix: Secondary | ICD-10-CM | POA: Diagnosis not present

## 2019-10-10 DIAGNOSIS — B373 Candidiasis of vulva and vagina: Secondary | ICD-10-CM

## 2019-10-10 MED ORDER — FLUCONAZOLE 150 MG PO TABS: 150.0000 mg | ORAL_TABLET | Freq: Once | ORAL | 3 refills | Status: AC

## 2019-10-10 MED ORDER — LEVONORGEST-ETH ESTRAD 91-DAY 0.15-0.03 MG PO TABS: 1.0000 | ORAL_TABLET | Freq: Every day | ORAL | 5 refills | Status: DC

## 2019-10-10 NOTE — Progress Notes (Signed)
Subjective:    [name] is a 38 y.o. married P3 (13, 50, and 97 yo daughters) who presents for an annual exam. The patient has no complaints today. The patient is sexually active. GYN screening history: last pap: was normal. The patient wears seatbelts: yes. The patient participates in regular exercise: yes. (walking, eliptical machine) Has the patient ever been transfused or tattooed?: no. The patient reports that there is not domestic violence in her life.   Menstrual History: OB History    Gravida  3   Para  3   Term      Preterm      AB      Living  3     SAB      TAB      Ectopic      Multiple      Live Births  3           Menarche age: 56 No LMP recorded. (Menstrual status: Oral contraceptives).    The following portions of the patient's history were reviewed and updated as appropriate: allergies, current medications, past family history, past medical history, past social history, past surgical history and problem list.  Review of Systems Pertinent items are noted in HPI.   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, needs refills   Objective:    BP 139/82   Pulse 76   Ht 5\' 6"  (1.676 m)   Wt 196 lb (88.9 kg)   BMI 31.64 kg/m   General Appearance:    Alert, cooperative, no distress, appears stated age  Head:    Normocephalic, without obvious abnormality, atraumatic  Eyes:    PERRL, conjunctiva/corneas clear, EOM's intact, fundi    benign, both eyes  Ears:    Normal TM's and external ear canals, both ears  Nose:   Nares normal, septum midline, mucosa normal, no drainage    or sinus tenderness  Throat:   Lips, mucosa, and tongue normal; teeth and gums normal  Neck:   Supple, symmetrical, trachea midline, no adenopathy;    thyroid:   no enlargement/tenderness/nodules; no carotid   bruit or JVD  Back:     Symmetric, no curvature, ROM normal, no CVA tenderness  Lungs:     Clear to auscultation bilaterally, respirations unlabored  Chest Wall:    No tenderness or deformity   Heart:    Regular rate and rhythm, S1 and S2 normal, no murmur, rub   or gallop  Breast Exam:    No tenderness, masses, or nipple abnormality  Abdomen:     Soft, non-tender, bowel sounds active all four quadrants,    no masses, no organomegaly  Genitalia:    Normal female without lesion  or tenderness, discharge c/w yeast, normal size and shape, anteverted, mobile, non-tender, normal adnexal exam      Extremities:   Extremities normal, atraumatic, no cyanosis or edema  Pulses:   2+ and symmetric all extremities  Skin:   Skin color, texture, turgor normal, no rashes or lesions  Lymph nodes:   Cervical, supraclavicular, and axillary nodes normal  Neurologic:   CNII-XII intact, normal strength, sensation and reflexes    throughout  .    Assessment:    Healthy female exam.    Plan:  Thin prep Pap smear. with cotesting She can have a refill of OCPs for 3 years without a pap smear. Diflucan prescribed and wet prep sent

## 2019-10-11 LAB — HEMOGLOBIN A1C
Est. average glucose Bld gHb Est-mCnc: 108 mg/dL
Hgb A1c MFr Bld: 5.4 % (ref 4.8–5.6)

## 2019-10-11 LAB — LIPID PANEL
Chol/HDL Ratio: 3.6 ratio (ref 0.0–4.4)
Cholesterol, Total: 160 mg/dL (ref 100–199)
HDL: 45 mg/dL (ref >39)
LDL Chol Calc (NIH): 101 mg/dL — ABNORMAL HIGH (ref 0–99)
Triglycerides: 71 mg/dL (ref 0–149)
VLDL Cholesterol Cal: 14 mg/dL (ref 5–40)

## 2019-10-11 LAB — CBC
Hematocrit: 46.8 % — ABNORMAL HIGH (ref 34.0–46.6)
Hemoglobin: 16 g/dL — ABNORMAL HIGH (ref 11.1–15.9)
MCH: 30.4 pg (ref 26.6–33.0)
MCHC: 34.2 g/dL (ref 31.5–35.7)
MCV: 89 fL (ref 79–97)
Platelets: 307 10*3/uL (ref 150–450)
RBC: 5.26 x10E6/uL (ref 3.77–5.28)
RDW: 12.7 % (ref 11.7–15.4)
WBC: 5.2 10*3/uL (ref 3.4–10.8)

## 2019-10-11 LAB — COMPREHENSIVE METABOLIC PANEL
ALT: 32 IU/L (ref 0–32)
AST: 19 IU/L (ref 0–40)
Albumin/Globulin Ratio: 1.4 (ref 1.2–2.2)
Albumin: 4.4 g/dL (ref 3.8–4.8)
Alkaline Phosphatase: 95 IU/L (ref 39–117)
BUN/Creatinine Ratio: 10 (ref 9–23)
BUN: 8 mg/dL (ref 6–24)
Bilirubin Total: 0.4 mg/dL (ref 0.0–1.2)
CO2: 23 mmol/L (ref 20–29)
Calcium: 9.8 mg/dL (ref 8.7–10.2)
Chloride: 103 mmol/L (ref 96–106)
Creatinine, Ser: 0.82 mg/dL (ref 0.57–1.00)
GFR calc Af Amer: 104 mL/min/{1.73_m2} (ref >59)
GFR calc non Af Amer: 90 mL/min/{1.73_m2} (ref >59)
Globulin, Total: 3.1 g/dL (ref 1.5–4.5)
Glucose: 97 mg/dL (ref 65–99)
Potassium: 4.6 mmol/L (ref 3.5–5.2)
Sodium: 137 mmol/L (ref 134–144)
Total Protein: 7.5 g/dL (ref 6.0–8.5)

## 2019-10-11 LAB — TSH: TSH: 1.48 u[IU]/mL (ref 0.450–4.500)

## 2019-10-11 LAB — VITAMIN D 25 HYDROXY (VIT D DEFICIENCY, FRACTURES): Vit D, 25-Hydroxy: 31.7 ng/mL (ref 30.0–100.0)

## 2019-10-11 LAB — CERVICOVAGINAL ANCILLARY ONLY
Bacterial Vaginitis (gardnerella): POSITIVE — AB
Candida Glabrata: NEGATIVE
Candida Vaginitis: POSITIVE — AB
Comment: NEGATIVE
Comment: NEGATIVE
Comment: NEGATIVE

## 2019-10-12 ENCOUNTER — Other Ambulatory Visit: Payer: Self-pay | Admitting: Obstetrics & Gynecology

## 2019-10-12 LAB — CYTOLOGY - PAP
Comment: NEGATIVE
Diagnosis: NEGATIVE
High risk HPV: NEGATIVE

## 2019-10-12 MED ORDER — METRONIDAZOLE 500 MG PO TABS: 500.0000 mg | ORAL_TABLET | Freq: Two times a day (BID) | ORAL | 0 refills | Status: DC

## 2019-10-12 NOTE — Progress Notes (Signed)
Flagyl prescribed for bv 

## 2020-01-11 ENCOUNTER — Ambulatory Visit
Admission: RE | Admit: 2020-01-11 | Discharge: 2020-01-11 | Disposition: A | Discharge status: Home / Self Care | Payer: 59 | Source: Ambulatory Visit | Attending: Obstetrics & Gynecology | Admitting: Obstetrics & Gynecology

## 2020-01-11 DIAGNOSIS — Z1231 Encounter for screening mammogram for malignant neoplasm of breast: Secondary | ICD-10-CM | POA: Diagnosis present

## 2020-01-11 DIAGNOSIS — Z01419 Encounter for gynecological examination (general) (routine) without abnormal findings: Secondary | ICD-10-CM

## 2020-08-19 ENCOUNTER — Encounter: Payer: Self-pay | Admitting: Radiology

## 2020-10-16 ENCOUNTER — Encounter: Payer: Self-pay | Admitting: Advanced Practice Midwife

## 2020-10-16 ENCOUNTER — Ambulatory Visit (INDEPENDENT_AMBULATORY_CARE_PROVIDER_SITE_OTHER): Payer: 59 | Admitting: Advanced Practice Midwife

## 2020-10-16 ENCOUNTER — Other Ambulatory Visit: Payer: Self-pay

## 2020-10-16 VITALS — BP 141/86 | HR 96 | Ht 66.0 in | Wt 199.0 lb

## 2020-10-16 DIAGNOSIS — Z0000 Encounter for general adult medical examination without abnormal findings: Secondary | ICD-10-CM | POA: Diagnosis not present

## 2020-10-16 DIAGNOSIS — Z3041 Encounter for surveillance of contraceptive pills: Secondary | ICD-10-CM | POA: Diagnosis not present

## 2020-10-16 DIAGNOSIS — Z3040 Encounter for surveillance of contraceptives, unspecified: Secondary | ICD-10-CM | POA: Diagnosis not present

## 2020-10-16 MED ORDER — LEVONORGEST-ETH ESTRAD 91-DAY 0.15-0.03 MG PO TABS: 1.0000 | ORAL_TABLET | Freq: Every day | ORAL | 11 refills | Status: DC

## 2020-10-16 NOTE — Patient Instructions (Signed)
Preventive Care 39 Years Old, Female Preventive care refers to visits with your health care provider and lifestyle choices that can promote health and wellness. This includes:  A yearly physical exam. This may also be called an annual well check.  Regular dental visits and eye exams.  Immunizations.  Screening for certain conditions.  Healthy lifestyle choices, such as eating a healthy diet, getting regular exercise, not using drugs or products that contain nicotine and tobacco, and limiting alcohol use. What can I expect for my preventive care visit? Physical exam Your health care provider will check your:  Height and weight. This may be used to calculate body mass index (BMI), which tells if you are at a healthy weight.  Heart rate and blood pressure.  Skin for abnormal spots. Counseling Your health care provider may ask you questions about your:  Alcohol, tobacco, and drug use.  Emotional well-being.  Home and relationship well-being.  Sexual activity.  Eating habits.  Work and work environment.  Method of birth control.  Menstrual cycle.  Pregnancy history. What immunizations do I need?  Influenza (flu) vaccine  This is recommended every year. Tetanus, diphtheria, and pertussis (Tdap) vaccine  You may need a Td booster every 10 years. Varicella (chickenpox) vaccine  You may need this if you have not been vaccinated. Zoster (shingles) vaccine  You may need this after age 60. Measles, mumps, and rubella (MMR) vaccine  You may need at least one dose of MMR if you were born in 1957 or later. You may also need a second dose. Pneumococcal conjugate (PCV13) vaccine  You may need this if you have certain conditions and were not previously vaccinated. Pneumococcal polysaccharide (PPSV23) vaccine  You may need one or two doses if you smoke cigarettes or if you have certain conditions. Meningococcal conjugate (MenACWY) vaccine  You may need this if you  have certain conditions. Hepatitis A vaccine  You may need this if you have certain conditions or if you travel or work in places where you may be exposed to hepatitis A. Hepatitis B vaccine  You may need this if you have certain conditions or if you travel or work in places where you may be exposed to hepatitis B. Haemophilus influenzae type b (Hib) vaccine  You may need this if you have certain conditions. Human papillomavirus (HPV) vaccine  If recommended by your health care provider, you may need three doses over 6 months. You may receive vaccines as individual doses or as more than one vaccine together in one shot (combination vaccines). Talk with your health care provider about the risks and benefits of combination vaccines. What tests do I need? Blood tests  Lipid and cholesterol levels. These may be checked every 5 years, or more frequently if you are over 41 years old.  Hepatitis C test.  Hepatitis B test. Screening  Lung cancer screening. You may have this screening every year starting at age 41 if you have a 30-pack-year history of smoking and currently smoke or have quit within the past 15 years.  Colorectal cancer screening. All adults should have this screening starting at age 41 and continuing until age 75. Your health care provider may recommend screening at age 45 if you are at increased risk. You will have tests every 1-10 years, depending on your results and the type of screening test.  Diabetes screening. This is done by checking your blood sugar (glucose) after you have not eaten for a while (fasting). You may have this   done every 1-3 years.  Mammogram. This may be done every 1-2 years. Talk with your health care provider about when you should start having regular mammograms. This may depend on whether you have a family history of breast cancer.  BRCA-related cancer screening. This may be done if you have a family history of breast, ovarian, tubal, or peritoneal  cancers.  Pelvic exam and Pap test. This may be done every 3 years starting at age 66. Starting at age 41, this may be done every 5 years if you have a Pap test in combination with an HPV test. Other tests  Sexually transmitted disease (STD) testing.  Bone density scan. This is done to screen for osteoporosis. You may have this scan if you are at high risk for osteoporosis. Follow these instructions at home: Eating and drinking  Eat a diet that includes fresh fruits and vegetables, whole grains, lean protein, and low-fat dairy.  Take vitamin and mineral supplements as recommended by your health care provider.  Do not drink alcohol if: ? Your health care provider tells you not to drink. ? You are pregnant, may be pregnant, or are planning to become pregnant.  If you drink alcohol: ? Limit how much you have to 0-1 drink a day. ? Be aware of how much alcohol is in your drink. In the [place] one drink equals one 12 oz bottle of beer (355 mL), one 5 oz glass of wine (148 mL), or one 1 oz glass of hard liquor (44 mL). Lifestyle  Take daily care of your teeth and gums.  Stay active. Exercise for at least 30 minutes on 5 or more days each week.  Do not use any products that contain nicotine or tobacco, such as cigarettes, e-cigarettes, and chewing tobacco. If you need help quitting, ask your health care provider.  If you are sexually active, practice safe sex. Use a condom or other form of birth control (contraception) in order to prevent pregnancy and STIs (sexually transmitted infections).  If told by your health care provider, take low-dose aspirin daily starting at age 41. What's next?  Visit your health care provider once a year for a well check visit.  Ask your health care provider how often you should have your eyes and teeth checked.  Stay up to date on all vaccines. This information is not intended to replace advice given to you by your health care provider. Make sure you  discuss any questions you have with your health care provider. Document Revised: 08/04/2018 Document Reviewed: 08/04/2018 Elsevier Patient Education  2020 [employer].

## 2020-10-16 NOTE — Progress Notes (Signed)
GYNECOLOGY ANNUAL PREVENTATIVE CARE ENCOUNTER NOTE  History:     [name] is a 39 y.o. G3P3 female here for a routine annual gynecologic exam.  Current complaints: none.   Denies abnormal vaginal bleeding, discharge, pelvic pain, problems with intercourse or other gynecologic concerns.    Gynecologic History No LMP recorded. (Menstrual status: Oral contraceptives). Contraception: OCP (estrogen/progesterone) Last Pap: 10/2019. Results were: normal with negative HPV Last mammogram: 01/11/2020. Results were: normal  Obstetric History OB History  Gravida Para Term Preterm AB Living  3 3       3   SAB TAB Ectopic Multiple Live Births          3    # Outcome Date GA Lbr Len/2nd Weight Sex Delivery Anes PTL Lv  3 Para 2007    F    LIV  2 Para 2003    F    LIV  1 Para 2000    F    LIV    History reviewed. No pertinent past medical history.  History reviewed. No pertinent surgical history.  Current Outpatient Medications on File Prior to Visit  Medication Sig Dispense Refill  . JOLESSA 0.15-0.03 MG tablet TAKE 1 TABLET BY MOUTH ONCE DAILY. APPOINTMENT NEEDED FOR FURTHER REFILLS 91 tablet 0   No current facility-administered medications on file prior to visit.    No Known Allergies  Social History:  reports that she has never smoked. She has never used smokeless tobacco. She reports that she does not drink alcohol and does not use drugs.  Family History  Problem Relation Age of Onset  . Cancer Sister        ovarian   . Stroke Father   . Breast cancer Neg Hx     The following portions of the patient's history were reviewed and updated as appropriate: allergies, current medications, past family history, past medical history, past social history, past surgical history and problem list.  Review of Systems Pertinent items noted in HPI and remainder of comprehensive ROS otherwise negative.  Physical Exam:  BP (!) 141/86   Pulse 96   Ht 5\' 6"  (1.676 m)   Wt 199 lb  (90.3 kg)   BMI 32.12 kg/m  CONSTITUTIONAL: Well-developed, well-nourished female in no acute distress.  HENT:  Normocephalic, atraumatic, External right and left ear normal. Oropharynx is clear and moist EYES: Conjunctivae and EOM are normal. Pupils are equal, round, and reactive to light. No scleral icterus.  NECK: Normal range of motion, supple, no masses.  Normal thyroid.  SKIN: Skin is warm and dry. No rash noted. Not diaphoretic. No erythema. No pallor. MUSCULOSKELETAL: Normal range of motion. No tenderness.  No cyanosis, clubbing, or edema.  2+ distal pulses. NEUROLOGIC: Alert and oriented to person, place, and time. Normal reflexes, muscle tone coordination.  PSYCHIATRIC: Normal mood and affect. Normal behavior. Normal judgment and thought content. CARDIOVASCULAR: Normal heart rate noted, regular rhythm RESPIRATORY: Clear to auscultation bilaterally. Effort and breath sounds normal, no problems with respiration noted. BREASTS: Symmetric in size. No masses, tenderness, skin changes, nipple drainage, or lymphadenopathy bilaterally. Performed in the presence of a chaperone. ABDOMEN: Soft, no distention noted.  No tenderness, rebound or guarding.     Assessment and Plan:    1. Well woman exam (no gynecological exam) - No concerning findings - S/p NILM 10/2019 - MM 3D SCREEN BREAST BILATERAL; Future  2. Encounter for surveillance of contraceptive pills - Happy with OCPs, declines discussion of alternatives  Routine preventative health maintenance measures emphasized. Please refer to After Visit Summary for other counseling recommendations.      Clayton Bibles, MSN, CNM Certified Nurse Midwife, [employer] for [employer], [facility] 10/16/20 8:40 PM

## 2021-07-23 ENCOUNTER — Other Ambulatory Visit: Payer: Self-pay | Admitting: *Deleted

## 2021-07-23 MED ORDER — LEVONORGEST-ETH ESTRAD 91-DAY 0.15-0.03 MG PO TABS: 1.0000 | ORAL_TABLET | Freq: Every day | ORAL | 3 refills | Status: DC

## 2021-09-19 ENCOUNTER — Encounter: Payer: Self-pay | Admitting: Radiology

## 2021-10-16 ENCOUNTER — Encounter: Payer: Self-pay | Admitting: Advanced Practice Midwife

## 2021-10-16 ENCOUNTER — Other Ambulatory Visit: Payer: Self-pay

## 2021-10-16 ENCOUNTER — Ambulatory Visit (INDEPENDENT_AMBULATORY_CARE_PROVIDER_SITE_OTHER): Payer: 59 | Admitting: Advanced Practice Midwife

## 2021-10-16 VITALS — BP 142/83 | HR 82 | Wt 184.0 lb

## 2021-10-16 DIAGNOSIS — Z3041 Encounter for surveillance of contraceptive pills: Secondary | ICD-10-CM

## 2021-10-16 DIAGNOSIS — Z0000 Encounter for general adult medical examination without abnormal findings: Secondary | ICD-10-CM

## 2021-10-16 DIAGNOSIS — Z1231 Encounter for screening mammogram for malignant neoplasm of breast: Secondary | ICD-10-CM | POA: Diagnosis not present

## 2021-10-16 DIAGNOSIS — Z01419 Encounter for gynecological examination (general) (routine) without abnormal findings: Secondary | ICD-10-CM | POA: Diagnosis not present

## 2021-10-16 MED ORDER — LEVONORGEST-ETH ESTRAD 91-DAY 0.15-0.03 MG PO TABS: 1.0000 | ORAL_TABLET | Freq: Every day | ORAL | 4 refills | Status: DC

## 2021-10-16 NOTE — Progress Notes (Signed)
Patient presents for Annual Exam.   Last pap: 10/10/2019 WNL  Mammogram: 01/11/20 WNL  Family Hx of Breast Cancer: None  Family Hx of Cervical Cancer: Mother Flu Vaccine : Declined.   CC: None

## 2021-10-17 NOTE — Progress Notes (Signed)
GYNECOLOGY ANNUAL PREVENTATIVE CARE ENCOUNTER NOTE  History:     [name] is a 40 y.o. G3P3 female here for a routine annual gynecologic exam.  Current complaints: none.   Denies abnormal vaginal bleeding, discharge, pelvic pain, problems with intercourse or other gynecologic concerns.   Non-smoker, positive self-image, denies HI, IPV.    Gynecologic History No LMP recorded. (Menstrual status: Oral contraceptives). Contraception: OCP (estrogen/progesterone) (taken continuously) Last Pap: 10/2019. Result was normal with negative HPV Last Mammogram: 01/11/2020.  Result was normal   Obstetric History OB History  Gravida Para Term Preterm AB Living  3 3       3   SAB IAB Ectopic Multiple Live Births          3    # Outcome Date GA Lbr Len/2nd Weight Sex Delivery Anes PTL Lv  3 Para 2007    F    LIV  2 Para 2003    F    LIV  1 Para 2000    F    LIV    History reviewed. No pertinent past medical history.  History reviewed. No pertinent surgical history.  No current outpatient medications on file prior to visit.   No current facility-administered medications on file prior to visit.    No Known Allergies  Social History:  reports that she has never smoked. She has never used smokeless tobacco. She reports that she does not drink alcohol and does not use drugs.  Family History  Problem Relation Age of Onset   Cancer Sister        ovarian    Stroke Father    Breast cancer Neg Hx     The following portions of the patient's history were reviewed and updated as appropriate: allergies, current medications, past family history, past medical history, past social history, past surgical history and problem list.  Review of Systems Pertinent items noted in HPI and remainder of comprehensive ROS otherwise negative.  Physical Exam:  BP (!) 142/83   Pulse 82   Wt 184 lb (83.5 kg)   BMI 29.70 kg/m  CONSTITUTIONAL: Well-developed, well-nourished female in no acute  distress.  HENT:  Normocephalic, atraumatic, External right and left ear normal.  EYES: Conjunctivae and EOM are normal. Pupils are equal, round, and reactive to light. No scleral icterus.  NECK: Normal range of motion, supple, no masses.  Normal thyroid.  SKIN: Skin is warm and dry. No rash noted. Not diaphoretic. No erythema. No pallor. MUSCULOSKELETAL: Normal range of motion. No tenderness.  No cyanosis, clubbing, or edema. NEUROLOGIC: Alert and oriented to person, place, and time. Normal reflexes, muscle tone coordination.  PSYCHIATRIC: Normal mood and affect. Normal behavior. Normal judgment and thought content. CARDIOVASCULAR: Normal heart rate noted, regular rhythm RESPIRATORY: Clear to auscultation bilaterally. Effort and breath sounds normal, no problems with respiration noted. BREASTS: Symmetric in size. No masses, tenderness, skin changes, nipple drainage, or lymphadenopathy bilaterally. Performed in the presence of a chaperone. ABDOMEN: Soft, no distention noted.  No tenderness, rebound or guarding.  PELVIC: Deferred   Assessment and Plan:    1. Well woman exam without gynecological exam - No abnormal findings on physical exam - Pap +HPV  due no later than 10/2024  2. Encounter for surveillance of contraceptive pills - Happy with current method, declines discussion of alternatives - levonorgestrel-ethinyl estradiol (JOLESSA) 0.15-0.03 MG tablet; Take 1 tablet by mouth daily.  Dispense: 91 tablet; Refill: 4  3. Breast cancer screening by mammogram  -  MM 3D SCREEN BREAST BILATERAL; Future  Mammogram scheduled Routine preventative health maintenance measures emphasized. Please refer to After Visit Summary for other counseling recommendations.     Total visit time: 30 min. Greater than 50% of visit spent in counseling and coordination of care  Clayton Bibles, MSA, MSN, CNM Certified Nurse Midwife, Biochemist, clinical for [employer], [facility]

## 2021-10-23 ENCOUNTER — Ambulatory Visit
Admission: RE | Admit: 2021-10-23 | Discharge: 2021-10-23 | Disposition: A | Discharge status: Home / Self Care | Payer: 59 | Source: Ambulatory Visit | Attending: Advanced Practice Midwife | Admitting: Advanced Practice Midwife

## 2021-10-23 ENCOUNTER — Other Ambulatory Visit: Payer: Self-pay

## 2021-10-23 DIAGNOSIS — Z1231 Encounter for screening mammogram for malignant neoplasm of breast: Secondary | ICD-10-CM | POA: Diagnosis present

## 2022-11-11 ENCOUNTER — Encounter: Payer: Self-pay | Admitting: Family

## 2022-11-11 ENCOUNTER — Ambulatory Visit: Payer: 59 | Admitting: Family

## 2022-11-11 ENCOUNTER — Other Ambulatory Visit (HOSPITAL_COMMUNITY)
Admission: RE | Admit: 2022-11-11 | Discharge: 2022-11-11 | Disposition: A | Discharge status: Home / Self Care | Payer: 59 | Source: Ambulatory Visit | Attending: Family | Admitting: Family

## 2022-11-11 VITALS — BP 140/90 | HR 76 | Temp 98.0°F | Ht 66.0 in | Wt 190.4 lb

## 2022-11-11 DIAGNOSIS — E669 Obesity, unspecified: Secondary | ICD-10-CM | POA: Diagnosis not present

## 2022-11-11 DIAGNOSIS — R030 Elevated blood-pressure reading, without diagnosis of hypertension: Secondary | ICD-10-CM | POA: Insufficient documentation

## 2022-11-11 DIAGNOSIS — Z1159 Encounter for screening for other viral diseases: Secondary | ICD-10-CM | POA: Diagnosis not present

## 2022-11-11 DIAGNOSIS — Z1231 Encounter for screening mammogram for malignant neoplasm of breast: Secondary | ICD-10-CM | POA: Diagnosis not present

## 2022-11-11 DIAGNOSIS — Z3041 Encounter for surveillance of contraceptive pills: Secondary | ICD-10-CM

## 2022-11-11 DIAGNOSIS — E7800 Pure hypercholesterolemia, unspecified: Secondary | ICD-10-CM

## 2022-11-11 DIAGNOSIS — Z114 Encounter for screening for human immunodeficiency virus [HIV]: Secondary | ICD-10-CM | POA: Diagnosis not present

## 2022-11-11 DIAGNOSIS — R69 Illness, unspecified: Secondary | ICD-10-CM | POA: Diagnosis not present

## 2022-11-11 DIAGNOSIS — B3731 Acute candidiasis of vulva and vagina: Secondary | ICD-10-CM

## 2022-11-11 DIAGNOSIS — Z01419 Encounter for gynecological examination (general) (routine) without abnormal findings: Secondary | ICD-10-CM | POA: Insufficient documentation

## 2022-11-11 DIAGNOSIS — D539 Nutritional anemia, unspecified: Secondary | ICD-10-CM | POA: Diagnosis not present

## 2022-11-11 DIAGNOSIS — Z124 Encounter for screening for malignant neoplasm of cervix: Secondary | ICD-10-CM | POA: Insufficient documentation

## 2022-11-11 DIAGNOSIS — N898 Other specified noninflammatory disorders of vagina: Secondary | ICD-10-CM | POA: Insufficient documentation

## 2022-11-11 DIAGNOSIS — Z113 Encounter for screening for infections with a predominantly sexual mode of transmission: Secondary | ICD-10-CM

## 2022-11-11 DIAGNOSIS — Z0001 Encounter for general adult medical examination with abnormal findings: Secondary | ICD-10-CM

## 2022-11-11 LAB — BASIC METABOLIC PANEL
BUN: 11 mg/dL (ref 6–23)
CO2: 26 mEq/L (ref 19–32)
Calcium: 9.3 mg/dL (ref 8.4–10.5)
Chloride: 106 mEq/L (ref 96–112)
Creatinine, Ser: 0.8 mg/dL (ref 0.40–1.20)
GFR: 89.96 mL/min (ref >60.00)
Glucose, Bld: 99 mg/dL (ref 70–99)
Potassium: 4.5 mEq/L (ref 3.5–5.1)
Sodium: 139 mEq/L (ref 135–145)

## 2022-11-11 LAB — CBC
HCT: 43.8 % (ref 36.0–46.0)
Hemoglobin: 15.3 g/dL — ABNORMAL HIGH (ref 12.0–15.0)
MCHC: 35 g/dL (ref 30.0–36.0)
MCV: 89.2 fl (ref 78.0–100.0)
Platelets: 297 10*3/uL (ref 150.0–400.0)
RBC: 4.91 Mil/uL (ref 3.87–5.11)
RDW: 12.7 % (ref 11.5–15.5)
WBC: 5.2 10*3/uL (ref 4.0–10.5)

## 2022-11-11 LAB — LIPID PANEL
Cholesterol: 171 mg/dL (ref 0–200)
HDL: 45.8 mg/dL (ref >39.00)
LDL Cholesterol: 111 mg/dL — ABNORMAL HIGH (ref 0–99)
NonHDL: 124.9
Total CHOL/HDL Ratio: 4
Triglycerides: 68 mg/dL (ref 0.0–149.0)
VLDL: 13.6 mg/dL (ref 0.0–40.0)

## 2022-11-11 LAB — VITAMIN B12: Vitamin B-12: 150 pg/mL — ABNORMAL LOW (ref 211–911)

## 2022-11-11 MED ORDER — LEVONORGEST-ETH ESTRAD 91-DAY 0.15-0.03 MG PO TABS: 1.0000 | ORAL_TABLET | Freq: Every day | ORAL | 4 refills | Status: DC

## 2022-11-11 MED ORDER — FLUCONAZOLE 150 MG PO TABS: ORAL_TABLET | ORAL | 0 refills | Status: DC

## 2022-11-11 NOTE — Assessment & Plan Note (Signed)
Pt advised of the following:  °Continue medication as prescribed. Monitor blood pressure periodically and/or when you feel symptomatic. Goal is <130/90 on average. Ensure that you have rested for 30 minutes prior to checking your blood pressure. Record your readings and bring them to your next visit if necessary.work on a low sodium diet. ° °

## 2022-11-11 NOTE — Progress Notes (Signed)
Established Patient Office Visit  Subjective:  Patient ID: [name], female    DOB: [birth year]  Age: 41 y.o. MRN: [id number]  CC:  Chief Complaint  Patient presents with   Establish Care    HPI [name] is here today for an well woman exam as well as here to establish care.   Prior provider was: has not seen primary in long time.  Sees Gyn Clayton Bibles however want sto be able to have one doctor for everything in one place.   10/23/21 mammogram  Pt is with slight concern.  Larey Seat down the stairs back in 2020, and ever since she notices that she has a ball on the back of her right knee. She feels as though it is fluid filled. Nontender. Doesn't bother the knee itself.   Tetanus: thinks had it at [facility] 2018 but not 100% Flu vaccine: declines  Pap 10/10/2019 ,negative  Pt is without acute concerns.   History reviewed. No pertinent past medical history.  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  Tobacco Use   Smoking status: Never   Smokeless tobacco: Never  Vaping Use   Vaping Use: Never used  Substance and Sexual Activity   Alcohol use: No   Drug use: No   Sexual activity: Yes    Partners: Male    Birth control/protection: Pill, OCP  Other Topics Concern   Not on file  Social History Narrative   Three girls, 23, 20, 16    Social Determinants of Corporate investment banker Strain: Not on file  Food  Insecurity: Not on file  Transportation Needs: Not on file  Physical Activity: Not on file  Stress: Not on file  Social Connections: Not on file  Intimate Partner Violence: Not on file    Outpatient Medications Prior to Visit  Medication Sig Dispense Refill   levonorgestrel-ethinyl estradiol (JOLESSA) 0.15-0.03 MG tablet Take 1 tablet by mouth daily. 91 tablet 4   No facility-administered medications prior to visit.    No Known Allergies  ROS Review of Systems  Review of Systems   Breasts: negative for nipple discharge, breast pain, breast mass, nipple changes, breast rash. Genitourinary:  Negative for decreased urine volume, difficulty urinating, dyspareunia, dysuria, flank pain, menstrual problem, pelvic pain, urgency, vaginal bleeding, vaginal discharge and vaginal pain.  Psychiatric/Behavioral:  Negative for depression and suicidal ideas.   All other systems reviewed and are negative.    Objective:    Physical Exam  Gen: NAD, resting comfortably Breasts: breasts appear normal, no suspicious masses, no skin or nipple changes or axillary nodes Physical Exam Genitourinary:    General: Normal vulva.     Pubic Area: No rash.  Labia:        Right: No rash, tenderness, lesion or injury.        Left: No rash, tenderness, lesion or injury.      Urethra: No prolapse or urethral pain.     Vagina: Normal. No vaginal discharge, tenderness or lesions.     Cervix: white thick discharge at cervix, nontender cervical os    Rectum: Normal.  Psych: Normal affect and thought content  BP (!) 140/90   Pulse 76   Temp 98 F (36.7 C) (Oral)   Ht 5\' 6"  (1.676 m)   Wt 190 lb 6.4 oz (86.4 kg)   SpO2 100%   BMI 30.73 kg/m  Wt Readings from Last 3 Encounters:  11/11/22 190 lb 6.4 oz (86.4 kg)  10/16/21 184 lb (83.5 kg)  10/16/20 199 lb (90.3 kg)     Health Maintenance Due  Topic Date Due   Hepatitis C Screening  Never done   DTaP/Tdap/Td (1 - Tdap) Never done   PAP  SMEAR-Modifier  10/09/2022    There are no preventive care reminders to display for this patient.  Lab Results  Component Value Date   TSH 1.480 10/10/2019   Lab Results  Component Value Date   WBC 5.2 10/10/2019   HGB 16.0 (H) 10/10/2019   HCT 46.8 (H) 10/10/2019   MCV 89 10/10/2019   PLT 307 10/10/2019   Lab Results  Component Value Date   NA 137 10/10/2019   K 4.6 10/10/2019   CO2 23 10/10/2019   GLUCOSE 97 10/10/2019   BUN 8 10/10/2019   CREATININE 0.82 10/10/2019   BILITOT 0.4 10/10/2019   ALKPHOS 95 10/10/2019   AST 19 10/10/2019   ALT 32 10/10/2019   PROT 7.5 10/10/2019   ALBUMIN 4.4 10/10/2019   CALCIUM 9.8 10/10/2019   Lab Results  Component Value Date   CHOL 160 10/10/2019   Lab Results  Component Value Date   HDL 45 10/10/2019   Lab Results  Component Value Date   LDLCALC 101 (H) 10/10/2019   Lab Results  Component Value Date   TRIG 71 10/10/2019   Lab Results  Component Value Date   CHOLHDL 3.6 10/10/2019   Lab Results  Component Value Date   HGBA1C 5.4 10/10/2019      Assessment & Plan:   Problem List Items Addressed This Visit       Other   Obesity (BMI 30-39.9) - Primary    Work on diet and exercise as tolerated       Relevant Orders   Basic metabolic panel   Encounter for well woman exam with routine gynecological exam    Pt verbalized consent for pelvic exam Declines std testing hpv thin prep ordered and pending results Pap exam in office completed, pt tolerated well.         Macrocytic anemia    Ordering b12 and cbc pending results      Relevant Orders   Vitamin B12   CBC   Elevated LDL cholesterol level    Ordered lipid panel, pending results. Work on low cholesterol diet and exercise as tolerated       Relevant Orders   Lipid panel   Vaginal discharge    Suspected Yeast, will send in diflucan. Also ordered urine ancillary pending results      Elevated blood pressure reading in office without  diagnosis of hypertension    Pt advised of the following:  Continue medication as prescribed. Monitor  blood pressure periodically and/or when you feel symptomatic. Goal is <130/90 on average. Ensure that you have rested for 30 minutes prior to checking your blood pressure. Record your readings and bring them to your next visit if necessary.work on a low sodium diet.       RESOLVED: Screening for HIV (human immunodeficiency virus)   Relevant Medications   fluconazole (DIFLUCAN) 150 MG tablet   RESOLVED: Encounter for hepatitis C screening test for low risk patient   RESOLVED: Encounter for general adult medical examination with abnormal findings   Other Visit Diagnoses     Encounter for screening mammogram for malignant neoplasm of breast       Relevant Orders   MM 3D SCREEN BREAST BILATERAL   Screening for cervical cancer       Relevant Orders   Cytology - PAP   Screening examination for STD (sexually transmitted disease)       Relevant Orders   RPR   HIV Antibody (routine testing w rflx)   Urine cytology ancillary only   Vaginal candida       Relevant Medications   fluconazole (DIFLUCAN) 150 MG tablet   Encounter for surveillance of contraceptive pills       Relevant Medications   levonorgestrel-ethinyl estradiol (JOLESSA) 0.15-0.03 MG tablet       Meds ordered this encounter  Medications   fluconazole (DIFLUCAN) 150 MG tablet    Sig: Take one po qd for one dose, then repeat again in three days    Dispense:  2 tablet    Refill:  0    Order Specific Question:   Supervising Provider    Answer:   BEDSOLE, AMY E [[id number]]   levonorgestrel-ethinyl estradiol (JOLESSA) 0.15-0.03 MG tablet    Sig: Take 1 tablet by mouth daily.    Dispense:  91 tablet    Refill:  4    Order Specific Question:   Supervising Provider    Answer:   Diona Browner, AMY E [[id number]]    Follow-up: No follow-ups on file.    Eugenia Pancoast, FNP

## 2022-11-11 NOTE — Assessment & Plan Note (Signed)
Ordered lipid panel, pending results. Work on low cholesterol diet and exercise as tolerated ? ?

## 2022-11-11 NOTE — Assessment & Plan Note (Signed)
Ordering b12 and cbc pending results

## 2022-11-11 NOTE — Assessment & Plan Note (Signed)
Suspected Yeast, will send in diflucan. Also ordered urine ancillary pending results

## 2022-11-11 NOTE — Patient Instructions (Addendum)
  Start monitoring your blood pressure daily, around the same time of day, for the next 2-3 weeks.  Ensure that you have rested for 30 minutes prior to checking your blood pressure. Record your readings and bring them to your next visit.  The goal is <140/90 if this is not the case please let me know and f/u sooner.    ------------------------------------  Stop by the lab prior to leaving today. I will notify you of your results once received.   Recommendations on keeping yourself healthy:  - Exercise at least 30-45 minutes a day, 3-4 days a week.  - Eat a low-fat diet with lots of fruits and vegetables, up to 7-9 servings per day.  - Seatbelts can save your life. Wear them always.  - Smoke detectors on every level of your home, check batteries every year.  - Eye Doctor - have an eye exam every 1-2 years  - Safe sex - if you may be exposed to STDs, use a condom.  - Alcohol -  If you drink, do it moderately, less than 2 drinks per day.  - Health Care Power of Attorney. Choose someone to speak for you if you are not able.  - Depression is common in our stressful world.If you're feeling down or losing interest in things you normally enjoy, please come in for a visit.  - Violence - If anyone is threatening or hurting you, please call immediately.  Due to recent changes in healthcare laws, you may see results of your imaging and/or laboratory studies on MyChart before I have had a chance to review them.  I understand that in some cases there may be results that are confusing or concerning to you. Please understand that not all results are received at the same time and often I may need to interpret multiple results in order to provide you with the best plan of care or course of treatment. Therefore, I ask that you please give me 2 business days to thoroughly review all your results before contacting my office for clarification. Should we see a critical lab result, you will be contacted sooner.   I  will see you again in one year for your annual comprehensive exam unless otherwise stated and or with acute concerns.  It was a pleasure seeing you today! Please do not hesitate to reach out with any questions and or concerns.  Regards,   [name]

## 2022-11-11 NOTE — Assessment & Plan Note (Addendum)
Pt verbalized consent for pelvic exam Declines std testing hpv thin prep ordered and pending results Pap exam in office completed, pt tolerated well.   

## 2022-11-11 NOTE — Assessment & Plan Note (Signed)
Work on diet and exercise as tolerated  ?

## 2022-11-12 ENCOUNTER — Telehealth: Payer: Self-pay | Admitting: Family

## 2022-11-12 LAB — CYTOLOGY - PAP
Comment: NEGATIVE
Diagnosis: NEGATIVE
High risk HPV: NEGATIVE

## 2022-11-12 LAB — HIV ANTIBODY (ROUTINE TESTING W REFLEX): HIV 1&2 Ab, 4th Generation: NONREACTIVE

## 2022-11-12 LAB — RPR: RPR Ser Ql: NONREACTIVE

## 2022-11-12 NOTE — Progress Notes (Signed)
Vitamin B12 very low, please set up for B12 injections, 1000 mcg IM once monthly for three months, also schedule 3 month f/u appt to repeat labs and f/u in office. Recommend also oral otc 1000 mcg once daily vitamin B12  Ldl slightly elevated, this is the bad cholesterol. Work on low cholesterol diet.  Syphillis negative.  Hiv negative.   Pap is negative however with yeast on pap. Take diflucan as sent in to pharmacy.

## 2022-11-12 NOTE — Telephone Encounter (Signed)
Jennie called from [facility] Cytology department that they receive pap vial and also there where orders for urine but didn't receive it and wanted to know if urine was taken. Call back number [phone].

## 2022-12-01 ENCOUNTER — Telehealth: Payer: Self-pay | Admitting: Family

## 2022-12-01 NOTE — Telephone Encounter (Signed)
Called patient to schedule appointments. One number voicemail is full and the other isn't setup.    Please call patient to schedule appointment listed below.   monthly b12 injections for 3 months and 3 month follow up office visit

## 2022-12-02 NOTE — Telephone Encounter (Signed)
Patient has been scheduled

## 2022-12-03 ENCOUNTER — Ambulatory Visit (INDEPENDENT_AMBULATORY_CARE_PROVIDER_SITE_OTHER): Payer: 59

## 2022-12-03 DIAGNOSIS — E538 Deficiency of other specified B group vitamins: Secondary | ICD-10-CM

## 2022-12-03 MED ORDER — CYANOCOBALAMIN 1000 MCG/ML IJ SOLN
1000.0000 ug | Freq: Once | INTRAMUSCULAR | Status: AC
  Administered 2022-12-03: 1000 ug via INTRAMUSCULAR

## 2022-12-03 NOTE — Progress Notes (Signed)
Per orders of Dr. Alphonsus Sias, injection of vitamin B12 given by Nanci Pina. Patient tolerated injection well.

## 2022-12-10 ENCOUNTER — Ambulatory Visit
Admission: RE | Admit: 2022-12-10 | Discharge: 2022-12-10 | Disposition: A | Discharge status: Home / Self Care | Payer: 59 | Source: Ambulatory Visit | Attending: Family | Admitting: Family

## 2022-12-10 DIAGNOSIS — Z1231 Encounter for screening mammogram for malignant neoplasm of breast: Secondary | ICD-10-CM | POA: Insufficient documentation

## 2023-01-05 ENCOUNTER — Ambulatory Visit (INDEPENDENT_AMBULATORY_CARE_PROVIDER_SITE_OTHER): Payer: 59

## 2023-01-05 DIAGNOSIS — E538 Deficiency of other specified B group vitamins: Secondary | ICD-10-CM | POA: Diagnosis not present

## 2023-01-05 MED ORDER — CYANOCOBALAMIN 1000 MCG/ML IJ SOLN
1000.0000 ug | Freq: Once | INTRAMUSCULAR | Status: AC
  Administered 2023-01-05: 1000 ug via INTRAMUSCULAR

## 2023-01-05 NOTE — Progress Notes (Signed)
Per orders of Tabitha Dugal, NP, injection of B-12 given by Tadeusz Stahl in right deltoid. Patient tolerated injection well.     

## 2023-02-04 ENCOUNTER — Ambulatory Visit: Payer: 59

## 2023-02-04 ENCOUNTER — Ambulatory Visit (INDEPENDENT_AMBULATORY_CARE_PROVIDER_SITE_OTHER): Payer: 59

## 2023-02-04 DIAGNOSIS — E538 Deficiency of other specified B group vitamins: Secondary | ICD-10-CM | POA: Diagnosis not present

## 2023-02-04 MED ORDER — CYANOCOBALAMIN 1000 MCG/ML IJ SOLN
1000.0000 ug | Freq: Once | INTRAMUSCULAR | Status: AC
  Administered 2023-02-04: 1000 ug via INTRAMUSCULAR

## 2023-02-04 NOTE — Progress Notes (Signed)
Per orders of  [facility] FNP, injection of Vitamin B 12 given in left deltoid given by Ozzie Hoyle. Patient tolerated injection well.

## 2023-02-10 ENCOUNTER — Encounter: Payer: Self-pay | Admitting: Family

## 2023-02-10 ENCOUNTER — Ambulatory Visit: Payer: 59 | Admitting: Family

## 2023-02-10 VITALS — BP 138/82 | HR 72 | Temp 98.1°F | Ht 66.0 in | Wt 195.6 lb

## 2023-02-10 DIAGNOSIS — E7800 Pure hypercholesterolemia, unspecified: Secondary | ICD-10-CM | POA: Diagnosis not present

## 2023-02-10 DIAGNOSIS — E538 Deficiency of other specified B group vitamins: Secondary | ICD-10-CM

## 2023-02-10 DIAGNOSIS — R030 Elevated blood-pressure reading, without diagnosis of hypertension: Secondary | ICD-10-CM | POA: Diagnosis not present

## 2023-02-10 DIAGNOSIS — R2241 Localized swelling, mass and lump, right lower limb: Secondary | ICD-10-CM | POA: Diagnosis not present

## 2023-02-10 LAB — VITAMIN B12: Vitamin B-12: 510 pg/mL (ref 211–911)

## 2023-02-10 NOTE — Assessment & Plan Note (Signed)
Pt advised to:  Work on low cholesterol diet and exercise as tolerated

## 2023-02-10 NOTE — Assessment & Plan Note (Signed)
U/s right lower leg  Suspect lipoma vs cyst  Pending results, will determine where to send. Likely general surgeon for possible resection

## 2023-02-10 NOTE — Assessment & Plan Note (Signed)
Recommendation to start b12 1000 mcg once daily otc  B12 ordered for today pending results.

## 2023-02-10 NOTE — Progress Notes (Signed)
Established Patient Office Visit  Subjective:      CC:  Chief Complaint  Patient presents with   Medical Management of Chronic Issues    HPI: [name] is a 42 y.o. female presenting on 02/10/2023 for Medical Management of Chronic Issues . HTN: last visit slight elevation of blood pressure. Advised to check blood pressure at home with goal <130/90. Denies cp palp and or sob. Average at home around 120/80 or so.   Lab Results  Component Value Date   CHOL 171 11/11/2022   HDL 45.80 11/11/2022   LDLCALC 111 (H) 11/11/2022   TRIG 68.0 11/11/2022   CHOLHDL 4 11/11/2022   Wt Readings from Last 3 Encounters:  02/10/23 195 lb 9.6 oz (88.7 kg)  11/11/22 190 lb 6.4 oz (86.4 kg)  10/16/21 184 lb (83.5 kg)   Temp Readings from Last 3 Encounters:  02/10/23 98.1 F (36.7 C) (Temporal)  11/11/22 98 F (36.7 C) (Oral)   BP Readings from Last 3 Encounters:  02/10/23 138/82  11/11/22 (!) 140/90  10/16/21 (!) 142/83   Pulse Readings from Last 3 Encounters:  02/10/23 72  11/11/22 76  10/16/21 82   Vitamin b12 def: recommended to start monthly b12 injections with oral b12 as well. Completed all three injections.   Lab Results  Component Value Date   VITAMINB12 150 (L) 11/11/2022    New complaints:  [place] down the stairs back in 2020, and ever since she notices that she has a ball on the back of her right knee. She feels as though it is fluid filled. Nontender. Doesn't bother the knee itself.  Denies soreness today, sore when it first came up but not tender anymore, just feels like a ball. Not increasing in size. Does not affect her ability to walk at all. Denies nerve pain or radiation of pain.       Social history:  Relevant past medical, surgical, family and social history reviewed and updated as indicated. Interim medical history since our last visit reviewed.  Allergies and medications reviewed and updated.  DATA REVIEWED: CHART IN EPIC     ROS: Negative  unless specifically indicated above in HPI.    Current Outpatient Medications:    levonorgestrel-ethinyl estradiol (JOLESSA) 0.15-0.03 MG tablet, Take 1 tablet by mouth daily., Disp: 91 tablet, Rfl: 4      Objective:    BP 138/82 Comment: left arm  Pulse 72   Temp 98.1 F (36.7 C) (Temporal)   Ht '5\' 6"'$  (1.676 m)   Wt 195 lb 9.6 oz (88.7 kg)   SpO2 99%   BMI 31.57 kg/m   Wt Readings from Last 3 Encounters:  02/10/23 195 lb 9.6 oz (88.7 kg)  11/11/22 190 lb 6.4 oz (86.4 kg)  10/16/21 184 lb (83.5 kg)    Physical Exam Constitutional:      General: She is not in acute distress.    Appearance: Normal appearance. She is obese. She is not ill-appearing, toxic-appearing or diaphoretic.  HENT:     Head: Normocephalic.  Cardiovascular:     Rate and Rhythm: Normal rate.  Pulmonary:     Effort: Pulmonary effort is normal.  Musculoskeletal:        General: Normal range of motion.  Skin:    Comments: Boggy cystic like knot raised on posterior lower thigh, directly above and medial to knee.  Neurological:     General: No focal deficit present.     Mental Status: She is alert  and oriented to person, place, and time. Mental status is at baseline.  Psychiatric:        Mood and Affect: Mood normal.        Behavior: Behavior normal.        Thought Content: Thought content normal.        Judgment: Judgment normal.             Assessment & Plan:  Elevated blood pressure reading in office with white coat syndrome, without diagnosis of hypertension Assessment & Plan: Stable.    Mass of right thigh Assessment & Plan: U/s right lower leg  Suspect lipoma vs cyst  Pending results, will determine where to send. Likely general surgeon for possible resection   Orders: -     US Venous Img Lower Unilateral Right (DVT); Future  Elevated LDL cholesterol level Assessment & Plan: Pt advised to:  Work on low cholesterol diet and exercise as tolerated    Vitamin B12  deficiency Assessment & Plan: Recommendation to start b12 1000 mcg once daily otc  B12 ordered for today pending results.      Return in about 1 year (around 02/10/2024) for f/u CPE.  Eugenia Pancoast, MSN, APRN, FNP-C [place]

## 2023-02-10 NOTE — Addendum Note (Signed)
Addended by: Eugenia Pancoast on: 02/10/2023 09:52 AM   Modules accepted: Orders

## 2023-02-10 NOTE — Assessment & Plan Note (Signed)
Stable. 

## 2023-02-10 NOTE — Patient Instructions (Addendum)
Stop by the lab prior to leaving today. I will notify you of your results once received.   I recommend you start taking over the counter B12 1000 mcg once daily.    ------------------------------------  I have sent in your order electronically for the following: ultrasound of right posterior thigh, soft tissue  at this location below. Please call to schedule the appointment at your convenience  [facility] outpatient imaging center off [address], [place] [address] Phone [phone]-  8-5 pm    Regards,   Eugenia Pancoast FNP-C

## 2023-02-24 ENCOUNTER — Other Ambulatory Visit: Payer: Self-pay | Admitting: Family

## 2023-02-24 ENCOUNTER — Ambulatory Visit
Admission: RE | Admit: 2023-02-24 | Discharge: 2023-02-24 | Disposition: A | Discharge status: Home / Self Care | Payer: 59 | Source: Ambulatory Visit | Attending: Family | Admitting: Family

## 2023-02-24 DIAGNOSIS — R2241 Localized swelling, mass and lump, right lower limb: Secondary | ICD-10-CM | POA: Diagnosis not present

## 2023-02-24 DIAGNOSIS — M7989 Other specified soft tissue disorders: Secondary | ICD-10-CM

## 2023-03-01 ENCOUNTER — Encounter: Payer: Self-pay | Admitting: Dermatology

## 2023-03-01 ENCOUNTER — Ambulatory Visit: Payer: 59 | Admitting: Dermatology

## 2023-03-01 DIAGNOSIS — D1723 Benign lipomatous neoplasm of skin and subcutaneous tissue of right leg: Secondary | ICD-10-CM

## 2023-03-01 NOTE — Progress Notes (Signed)
   New Patient Visit   Subjective  [name] is a 42 y.o. female who presents for the following: spot at back of right knee, present for 3-4 years. Patient noticed it come up after sliding down the steps, has not changed in size. No symptoms. No personal or fhx skin cancer.    The following portions of the chart were reviewed this encounter and updated as appropriate: medications, allergies, medical history  Review of Systems:  No other skin or systemic complaints except as noted in HPI or Assessment and Plan.  Objective  Well appearing patient in no apparent distress; mood and affect are within normal limits.   A focused examination was performed of the following areas: Right leg  Relevant exam findings are noted in the Assessment and Plan.    Assessment & Plan   Lipoma  Exam: Subcutaneous rubbery nodule 2.5 cm Location: right superior popliteal  Benign-appearing. Exam most consistent with a lipoma. Discussed that a lipoma is a benign fatty growth that can grow over time and sometimes get irritated. Recommend observation if it is not bothersome or changing. Discussed option of ILK injections or surgical excision to remove it if it is growing, symptomatic, or other changes noted. Please call for new or changing lesions so they can be evaluated.  Patient had ultrasound 02/24/23 showing a 2.1 cm subcutaneous mass, most likely an epidermal inclusion cyst per ultrasound results.   Pt prefers to observe lesion for now since not changing and not bothersome    Return in about 6 months (around 09/01/2023) for recheck lipoma vs cyst.  Graciella Belton, RMA, am acting as scribe for Brendolyn Patty, MD .   Documentation: I have reviewed the above documentation for accuracy and completeness, and I agree with the above.  Brendolyn Patty, MD

## 2023-03-01 NOTE — Patient Instructions (Signed)
Due to recent changes in healthcare laws, you may see results of your pathology and/or laboratory studies on MyChart before the doctors have had a chance to review them. We understand that in some cases there may be results that are confusing or concerning to you. Please understand that not all results are received at the same time and often the doctors may need to interpret multiple results in order to provide you with the best plan of care or course of treatment. Therefore, we ask that you please give us 2 business days to thoroughly review all your results before contacting the office for clarification. Should we see a critical lab result, you will be contacted sooner.   If You Need Anything After Your Visit  If you have any questions or concerns for your doctor, please call our main line at [phone] and press option 4 to reach your doctor's medical assistant. If no one answers, please leave a voicemail as directed and we will return your call as soon as possible. Messages left after 4 pm will be answered the following business day.   You may also send us a message via MyChart. We typically respond to MyChart messages within 1-2 business days.  For prescription refills, please ask your pharmacy to contact our office. Our fax number is [phone].  If you have an urgent issue when the clinic is closed that cannot wait until the next business day, you can page your doctor at the number below.    Please note that while we do our best to be available for urgent issues outside of office hours, we are not available 24/7.   If you have an urgent issue and are unable to reach us, you may choose to seek medical care at your doctor's office, retail clinic, urgent care center, or emergency room.  If you have a medical emergency, please immediately call 911 or go to the emergency department.  Pager Numbers  - Dr. Kowalski: [phone]  - Dr. Moye: [phone]  - Dr. Stewart:  [phone]  In the event of inclement weather, please call our main line at [phone] for an update on the status of any delays or closures.  Dermatology Medication Tips: Please keep the boxes that topical medications come in in order to help keep track of the instructions about where and how to use these. Pharmacies typically print the medication instructions only on the boxes and not directly on the medication tubes.   If your medication is too expensive, please contact our office at [phone] option 4 or send us a message through MyChart.   We are unable to tell what your co-pay for medications will be in advance as this is different depending on your insurance coverage. However, we may be able to find a substitute medication at lower cost or fill out paperwork to get insurance to cover a needed medication.   If a prior authorization is required to get your medication covered by your insurance company, please allow us 1-2 business days to complete this process.  Drug prices often vary depending on where the prescription is filled and some pharmacies may offer cheaper prices.  The website www.goodrx.com contains coupons for medications through different pharmacies. The prices here do not account for what the cost may be with help from insurance (it may be cheaper with your insurance), but the website can give you the price if you did not use any insurance.  - You can print the associated coupon and take it with   your prescription to the pharmacy.  - You may also stop by our office during regular business hours and pick up a GoodRx coupon card.  - If you need your prescription sent electronically to a different pharmacy, notify our office through [place] MyChart or by phone at [phone] option 4.     Si Usted Necesita Algo Despus de Su Visita  Tambin puede enviarnos un mensaje a travs de MyChart. Por lo general respondemos a los mensajes de MyChart en el transcurso de 1 a 2  das hbiles.  Para renovar recetas, por favor pida a su farmacia que se ponga en contacto con nuestra oficina. Nuestro nmero de fax es el [phone].  Si tiene un asunto urgente cuando la clnica est cerrada y que no puede esperar hasta el siguiente da hbil, puede llamar/localizar a su doctor(a) al nmero que aparece a continuacin.   Por favor, tenga en cuenta que aunque hacemos todo lo posible para estar disponibles para asuntos urgentes fuera del horario de oficina, no estamos disponibles las 24 horas del da, los 7 das de la semana.   Si tiene un problema urgente y no puede comunicarse con nosotros, puede optar por buscar atencin mdica  en el consultorio de su doctor(a), en una clnica privada, en un centro de atencin urgente o en una sala de emergencias.  Si tiene una emergencia mdica, por favor llame inmediatamente al 911 o vaya a la sala de emergencias.  Nmeros de bper  - Dr. Kowalski: [phone]  - Dra. Moye: [phone]  - Dra. Stewart: [phone]  En caso de inclemencias del tiempo, por favor llame a nuestra lnea principal al [phone] para una actualizacin sobre el estado de cualquier retraso o cierre.  Consejos para la medicacin en dermatologa: Por favor, guarde las cajas en las que vienen los medicamentos de uso tpico para ayudarle a seguir las instrucciones sobre dnde y cmo usarlos. Las farmacias generalmente imprimen las instrucciones del medicamento slo en las cajas y no directamente en los tubos del medicamento.   Si su medicamento es muy caro, por favor, pngase en contacto con nuestra oficina llamando al [phone] y presione la opcin 4 o envenos un mensaje a travs de MyChart.   No podemos decirle cul ser su copago por los medicamentos por adelantado ya que esto es diferente dependiendo de la cobertura de su seguro. Sin embargo, es posible que podamos encontrar un medicamento sustituto a menor costo o llenar un formulario para que el  seguro cubra el medicamento que se considera necesario.   Si se requiere una autorizacin previa para que su compaa de seguros cubra su medicamento, por favor permtanos de 1 a 2 das hbiles para completar este proceso.  Los precios de los medicamentos varan con frecuencia dependiendo del lugar de dnde se surte la receta y alguna farmacias pueden ofrecer precios ms baratos.  El sitio web www.goodrx.com tiene cupones para medicamentos de diferentes farmacias. Los precios aqu no tienen en cuenta lo que podra costar con la ayuda del seguro (puede ser ms barato con su seguro), pero el sitio web puede darle el precio si no utiliz ningn seguro.  - Puede imprimir el cupn correspondiente y llevarlo con su receta a la farmacia.  - Tambin puede pasar por nuestra oficina durante el horario de atencin regular y recoger una tarjeta de cupones de GoodRx.  - Si necesita que su receta se enve electrnicamente a una farmacia diferente, informe a nuestra oficina a travs de MyChart de [employer]   o por telfono llamando al [phone] y presione la opcin 4.  

## 2023-09-06 ENCOUNTER — Ambulatory Visit: Payer: 59 | Admitting: Dermatology

## 2023-11-27 ENCOUNTER — Other Ambulatory Visit: Payer: Self-pay | Admitting: Family

## 2023-11-27 DIAGNOSIS — Z3041 Encounter for surveillance of contraceptive pills: Secondary | ICD-10-CM

## 2024-02-10 ENCOUNTER — Encounter: Payer: Self-pay | Admitting: Family

## 2024-02-10 ENCOUNTER — Ambulatory Visit (INDEPENDENT_AMBULATORY_CARE_PROVIDER_SITE_OTHER): Payer: 59 | Admitting: Family

## 2024-02-10 VITALS — BP 136/68 | HR 70 | Temp 98.5°F | Ht 66.0 in | Wt 199.0 lb

## 2024-02-10 DIAGNOSIS — E7800 Pure hypercholesterolemia, unspecified: Secondary | ICD-10-CM

## 2024-02-10 DIAGNOSIS — E538 Deficiency of other specified B group vitamins: Secondary | ICD-10-CM | POA: Diagnosis not present

## 2024-02-10 DIAGNOSIS — Z1211 Encounter for screening for malignant neoplasm of colon: Secondary | ICD-10-CM

## 2024-02-10 DIAGNOSIS — Z202 Contact with and (suspected) exposure to infections with a predominantly sexual mode of transmission: Secondary | ICD-10-CM

## 2024-02-10 DIAGNOSIS — Z0000 Encounter for general adult medical examination without abnormal findings: Secondary | ICD-10-CM | POA: Diagnosis not present

## 2024-02-10 DIAGNOSIS — Z1231 Encounter for screening mammogram for malignant neoplasm of breast: Secondary | ICD-10-CM

## 2024-02-10 DIAGNOSIS — Z3041 Encounter for surveillance of contraceptive pills: Secondary | ICD-10-CM

## 2024-02-10 DIAGNOSIS — E669 Obesity, unspecified: Secondary | ICD-10-CM

## 2024-02-10 LAB — VITAMIN B12: Vitamin B-12: 589 pg/mL (ref 211–911)

## 2024-02-10 LAB — CBC
HCT: 45.7 % (ref 36.0–46.0)
Hemoglobin: 15.3 g/dL — ABNORMAL HIGH (ref 12.0–15.0)
MCHC: 33.5 g/dL (ref 30.0–36.0)
MCV: 91.6 fl (ref 78.0–100.0)
Platelets: 298 10*3/uL (ref 150.0–400.0)
RBC: 4.99 Mil/uL (ref 3.87–5.11)
RDW: 12.9 % (ref 11.5–15.5)
WBC: 5.1 10*3/uL (ref 4.0–10.5)

## 2024-02-10 LAB — LIPID PANEL
Cholesterol: 146 mg/dL (ref 0–200)
HDL: 46.4 mg/dL (ref >39.00)
LDL Cholesterol: 89 mg/dL (ref 0–99)
NonHDL: 99.4
Total CHOL/HDL Ratio: 3
Triglycerides: 54 mg/dL (ref 0.0–149.0)
VLDL: 10.8 mg/dL (ref 0.0–40.0)

## 2024-02-10 LAB — BASIC METABOLIC PANEL
BUN: 13 mg/dL (ref 6–23)
CO2: 27 meq/L (ref 19–32)
Calcium: 9.8 mg/dL (ref 8.4–10.5)
Chloride: 106 meq/L (ref 96–112)
Creatinine, Ser: 0.76 mg/dL (ref 0.40–1.20)
GFR: 94.84 mL/min (ref >60.00)
Glucose, Bld: 96 mg/dL (ref 70–99)
Potassium: 4.6 meq/L (ref 3.5–5.1)
Sodium: 140 meq/L (ref 135–145)

## 2024-02-10 LAB — TSH: TSH: 1.17 u[IU]/mL (ref 0.35–5.50)

## 2024-02-10 MED ORDER — LEVONORGEST-ETH ESTRAD 91-DAY 0.15-0.03 MG PO TABS: 1.0000 | ORAL_TABLET | Freq: Every day | ORAL | 3 refills | Status: AC

## 2024-02-10 NOTE — Patient Instructions (Signed)
 A referral was placed today to gi  Please let us know if you have not heard back within 2 weeks about the referral.  Call med center [facility] for mammogram

## 2024-02-10 NOTE — Progress Notes (Signed)
 Subjective:  Patient ID: [name], female    DOB: 16-Jul-1981  Age: 43 y.o. MRN: [id number]  Patient Care Team: [name], FNP as PCP - General (Family Medicine) Calvert Cantor, CNM (Inactive) as Midwife (Certified Nurse Midwife)   CC:  Chief Complaint  Patient presents with   Annual Exam    HPI [name] is a 43 y.o. female who presents today for an annual physical exam. She reports consuming a general diet.  Insanity at home as able, tries to do 3-5 times a week  She generally feels well. She reports sleeping fairly well. She does not have additional problems to discuss today.   Vision:Not within last year Dental:No regular dental care  Mammogram: 12/10/22  Last pap: 11/11/22 negative Colonoscopy: will order never had Tdap, thinks she might have had it a few years ago and thinks that she got the tdap then, will need to check.   Pt is without acute concerns.   Elevated bp in office, average at home in the 120's / over 70   [place] tissues right posterior thigh,u/s 02/24/23 with radiologist recommendation to r/o dermal malignancy. Pt did see dermatology, tara stewart, who suspected lipoma discussed options for resection if symptomatic, but pt with decision to keep for now as not with symptoms.   Advanced Directives Patient does not have advanced directives   DEPRESSION SCREENING    02/10/2024    9:25 AM 02/10/2023    9:26 AM  PHQ 2/9 Scores  PHQ - 2 Score 0 0  PHQ- 9 Score 0      ROS: Negative unless specifically indicated above in HPI.    Current Outpatient Medications:    levonorgestrel-ethinyl estradiol (JOLESSA) 0.15-0.03 MG tablet, Take 1 tablet by mouth daily., Disp: 91 tablet, Rfl: 3    Objective:    BP 136/68 (BP Location: Left Arm, Patient Position: Sitting, Cuff Size: Normal)   Pulse 70   Temp 98.5 F (36.9 C) (Temporal)   Ht 5\' 6"  (1.676 m)   Wt 199 lb (90.3 kg)   SpO2 98%   BMI 32.12 kg/m   BP Readings from Last 3  Encounters:  02/10/24 136/68  02/10/23 138/82  11/11/22 (!) 140/90      Physical Exam Vitals reviewed.  Constitutional:      General: She is not in acute distress.    Appearance: Normal appearance. She is obese. She is not ill-appearing.  HENT:     Head: Normocephalic.     Right Ear: Tympanic membrane normal.     Left Ear: Tympanic membrane normal.     Nose: Nose normal.     Mouth/Throat:     Mouth: Mucous membranes are moist.  Eyes:     Extraocular Movements: Extraocular movements intact.     Pupils: Pupils are equal, round, and reactive to light.  Cardiovascular:     Rate and Rhythm: Normal rate and regular rhythm.  Pulmonary:     Effort: Pulmonary effort is normal.     Breath sounds: Normal breath sounds.  Abdominal:     General: Abdomen is flat. Bowel sounds are normal.     Palpations: Abdomen is soft.     Tenderness: There is no guarding or rebound.  Musculoskeletal:        General: Normal range of motion.     Cervical back: Normal range of motion.  Skin:    General: Skin is warm.     Capillary Refill: Capillary refill takes less  than 2 seconds.  Neurological:     General: No focal deficit present.     Mental Status: She is alert.  Psychiatric:        Mood and Affect: Mood normal.        Behavior: Behavior normal.        Thought Content: Thought content normal.        Judgment: Judgment normal.          Assessment & Plan:  Screening mammogram for breast cancer -     3D Screening Mammogram, Left and Right; Future  Screening for colon cancer -     Ambulatory referral to Gastroenterology  Encounter for assessment of STD exposure -     RPR -     HIV Antibody (routine testing w rflx) -     Hepatitis C antibody -     Chlamydia/Gonococcus/Trichomonas, NAA  Vitamin B12 deficiency Assessment & Plan: Checking b12 pending results Taking otc sl unsure of dose  Orders: -     Vitamin B12  Obesity (BMI 30-39.9) Assessment & Plan: Work on diet and  exercise as tolerated    Encounter for general adult medical examination without abnormal findings Assessment & Plan: Patient Counseling(The following topics were reviewed):  Preventative care handout given to pt  Health maintenance and immunizations reviewed. Please refer to Health maintenance section. Pt advised on safe sex, wearing seatbelts in car, and proper nutrition labwork ordered today for annual Dental health: Discussed importance of regular tooth brushing, flossing, and dental visits.    Orders: -     Lipid panel -     Basic metabolic panel -     CBC -     TSH  Elevated LDL cholesterol level Assessment & Plan: Pt advised to:  Work on low cholesterol diet and exercise as tolerated Lipid panel ordered pending results  Orders: -     Lipid panel  Encounter for surveillance of contraceptive pills -     Levonorgest-Eth Estrad 91-Day; Take 1 tablet by mouth daily.  Dispense: 91 tablet; Refill: 3      Follow-up: Return in about 1 year (around 02/09/2025) for f/u CPE.   [name], FNP

## 2024-02-10 NOTE — Assessment & Plan Note (Addendum)
 Checking b12 pending results Taking otc sl unsure of dose

## 2024-02-10 NOTE — Assessment & Plan Note (Signed)
Work on diet and exercise as tolerated  ?

## 2024-02-10 NOTE — Assessment & Plan Note (Signed)

## 2024-02-10 NOTE — Assessment & Plan Note (Signed)
 Pt advised to:  Work on low cholesterol diet and exercise as tolerated Lipid panel ordered pending results

## 2024-02-10 NOTE — Addendum Note (Signed)
 Addended by: Alvina Chou on: 02/10/2024 10:08 AM   Modules accepted: Orders

## 2024-02-11 DIAGNOSIS — Z202 Contact with and (suspected) exposure to infections with a predominantly sexual mode of transmission: Secondary | ICD-10-CM | POA: Diagnosis not present

## 2024-02-11 LAB — HIV ANTIBODY (ROUTINE TESTING W REFLEX): HIV 1&2 Ab, 4th Generation: NONREACTIVE

## 2024-02-11 LAB — HEPATITIS C ANTIBODY: Hepatitis C Ab: NONREACTIVE

## 2024-02-11 LAB — RPR: RPR Ser Ql: NONREACTIVE

## 2024-02-14 LAB — CHLAMYDIA/GONOCOCCUS/TRICHOMONAS, NAA
Chlamydia by NAA: NEGATIVE
Gonococcus by NAA: NEGATIVE
Trich vag by NAA: NEGATIVE

## 2024-02-22 ENCOUNTER — Encounter: Payer: Self-pay | Admitting: *Deleted

## 2024-07-26 ENCOUNTER — Ambulatory Visit
Admission: RE | Admit: 2024-07-26 | Discharge: 2024-07-26 | Disposition: A | Discharge status: Home / Self Care | Source: Ambulatory Visit | Attending: Family | Admitting: Family

## 2024-07-26 DIAGNOSIS — Z1231 Encounter for screening mammogram for malignant neoplasm of breast: Secondary | ICD-10-CM | POA: Diagnosis not present

## 2024-07-31 ENCOUNTER — Ambulatory Visit: Payer: Self-pay | Admitting: Family

## 2025-02-12 ENCOUNTER — Encounter: Admitting: Family
